# Patient Record
Sex: Female | Born: 1945 | Race: White | Hispanic: No
Health system: Midwestern US, Community
[De-identification: ages and names within clinical notes are randomized; demographics above are authoritative.]

## PROBLEM LIST (undated history)

## (undated) DIAGNOSIS — R55 Syncope and collapse: Secondary | ICD-10-CM

## (undated) DIAGNOSIS — M810 Age-related osteoporosis without current pathological fracture: Secondary | ICD-10-CM

## (undated) DIAGNOSIS — E785 Hyperlipidemia, unspecified: Secondary | ICD-10-CM

## (undated) DIAGNOSIS — I8390 Asymptomatic varicose veins of unspecified lower extremity: Secondary | ICD-10-CM

## (undated) DIAGNOSIS — Z933 Colostomy status: Secondary | ICD-10-CM

## (undated) DIAGNOSIS — K219 Gastro-esophageal reflux disease without esophagitis: Secondary | ICD-10-CM

## (undated) DIAGNOSIS — F439 Reaction to severe stress, unspecified: Secondary | ICD-10-CM

## (undated) DIAGNOSIS — K5792 Diverticulitis of intestine, part unspecified, without perforation or abscess without bleeding: Secondary | ICD-10-CM

## (undated) DIAGNOSIS — K4090 Unilateral inguinal hernia, without obstruction or gangrene, not specified as recurrent: Secondary | ICD-10-CM

## (undated) DIAGNOSIS — Z9289 Personal history of other medical treatment: Secondary | ICD-10-CM

## (undated) DIAGNOSIS — Z87898 Personal history of other specified conditions: Secondary | ICD-10-CM

## (undated) DIAGNOSIS — Z9109 Other allergy status, other than to drugs and biological substances: Secondary | ICD-10-CM

## (undated) DIAGNOSIS — I10 Essential (primary) hypertension: Secondary | ICD-10-CM

## (undated) DIAGNOSIS — M5430 Sciatica, unspecified side: Secondary | ICD-10-CM

## (undated) DIAGNOSIS — K635 Polyp of colon: Secondary | ICD-10-CM

## (undated) HISTORY — DX: Essential (primary) hypertension: I10

## (undated) HISTORY — DX: Hyperlipidemia, unspecified: E78.5

## (undated) HISTORY — DX: Polyp of colon: K63.5

## (undated) HISTORY — DX: Unilateral inguinal hernia, without obstruction or gangrene, not specified as recurrent: K40.90

## (undated) HISTORY — PX: COLON SURGERY: SHX602

---
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
## 1972-11-10 HISTORY — PX: ECTOPIC PREGNANCY SURGERY: SHX613

## 2000-09-11 ENCOUNTER — Ambulatory Visit (HOSPITAL_COMMUNITY): Admission: RE | Admit: 2000-09-11 | Discharge: 2000-09-11 | Payer: Self-pay | Admitting: Gastroenterology

## 2000-09-16 ENCOUNTER — Ambulatory Visit (HOSPITAL_COMMUNITY): Admission: RE | Admit: 2000-09-16 | Discharge: 2000-09-16 | Payer: Self-pay | Admitting: Family Medicine

## 2000-09-16 ENCOUNTER — Encounter: Payer: Self-pay | Admitting: Family Medicine

## 2000-09-24 ENCOUNTER — Other Ambulatory Visit: Admission: RE | Admit: 2000-09-24 | Discharge: 2000-09-24 | Payer: Self-pay | Admitting: Family Medicine

## 2001-04-21 ENCOUNTER — Encounter: Payer: Self-pay | Admitting: Family Medicine

## 2001-04-21 ENCOUNTER — Encounter: Admission: RE | Admit: 2001-04-21 | Discharge: 2001-04-21 | Payer: Self-pay | Admitting: Family Medicine

## 2001-05-05 ENCOUNTER — Encounter: Admission: RE | Admit: 2001-05-05 | Discharge: 2001-06-09 | Payer: Self-pay | Admitting: Family Medicine

## 2003-01-22 ENCOUNTER — Encounter: Payer: Self-pay | Admitting: Emergency Medicine

## 2003-01-22 ENCOUNTER — Emergency Department (HOSPITAL_COMMUNITY): Admission: EM | Admit: 2003-01-22 | Discharge: 2003-01-22 | Payer: Self-pay | Admitting: Emergency Medicine

## 2003-03-07 ENCOUNTER — Ambulatory Visit (HOSPITAL_COMMUNITY): Admission: RE | Admit: 2003-03-07 | Discharge: 2003-03-07 | Payer: Self-pay | Admitting: *Deleted

## 2003-03-14 ENCOUNTER — Ambulatory Visit (HOSPITAL_COMMUNITY): Admission: RE | Admit: 2003-03-14 | Discharge: 2003-03-14 | Payer: Self-pay | Admitting: Internal Medicine

## 2004-07-17 ENCOUNTER — Other Ambulatory Visit: Admission: RE | Admit: 2004-07-17 | Discharge: 2004-07-17 | Payer: Self-pay | Admitting: Family Medicine

## 2005-11-20 ENCOUNTER — Other Ambulatory Visit: Admission: RE | Admit: 2005-11-20 | Discharge: 2005-11-20 | Payer: Self-pay | Admitting: Family Medicine

## 2005-11-24 ENCOUNTER — Ambulatory Visit (HOSPITAL_COMMUNITY): Admission: RE | Admit: 2005-11-24 | Discharge: 2005-11-24 | Payer: Self-pay | Admitting: Family Medicine

## 2006-09-17 ENCOUNTER — Emergency Department (HOSPITAL_COMMUNITY): Admission: EM | Admit: 2006-09-17 | Discharge: 2006-09-17 | Payer: Self-pay | Admitting: Emergency Medicine

## 2006-09-24 ENCOUNTER — Inpatient Hospital Stay (HOSPITAL_COMMUNITY): Admission: EM | Admit: 2006-09-24 | Discharge: 2006-10-04 | Payer: Self-pay | Admitting: Emergency Medicine

## 2006-10-19 ENCOUNTER — Ambulatory Visit (HOSPITAL_COMMUNITY): Admission: RE | Admit: 2006-10-19 | Discharge: 2006-10-19 | Payer: Self-pay | Admitting: Surgery

## 2007-01-13 ENCOUNTER — Other Ambulatory Visit: Admission: RE | Admit: 2007-01-13 | Discharge: 2007-01-13 | Payer: Self-pay | Admitting: Family Medicine

## 2007-01-15 ENCOUNTER — Encounter: Admission: RE | Admit: 2007-01-15 | Discharge: 2007-01-15 | Payer: Self-pay | Admitting: General Surgery

## 2008-07-19 ENCOUNTER — Other Ambulatory Visit: Admission: RE | Admit: 2008-07-19 | Discharge: 2008-07-19 | Payer: Self-pay | Admitting: Family Medicine

## 2008-08-25 ENCOUNTER — Ambulatory Visit (HOSPITAL_COMMUNITY): Admission: RE | Admit: 2008-08-25 | Discharge: 2008-08-25 | Payer: Self-pay | Admitting: Family Medicine

## 2010-11-10 HISTORY — PX: HERNIA REPAIR: SHX51

## 2011-03-20 ENCOUNTER — Inpatient Hospital Stay (HOSPITAL_COMMUNITY)
Admission: EM | Admit: 2011-03-20 | Discharge: 2011-03-27 | DRG: 330 | Disposition: A | Discharge status: Skilled Nursing Facility | Payer: 59 | Attending: General Surgery | Admitting: General Surgery

## 2011-03-20 DIAGNOSIS — E785 Hyperlipidemia, unspecified: Secondary | ICD-10-CM | POA: Diagnosis present

## 2011-03-20 DIAGNOSIS — G40909 Epilepsy, unspecified, not intractable, without status epilepticus: Secondary | ICD-10-CM | POA: Diagnosis present

## 2011-03-20 DIAGNOSIS — I10 Essential (primary) hypertension: Secondary | ICD-10-CM | POA: Diagnosis present

## 2011-03-20 DIAGNOSIS — K4030 Unilateral inguinal hernia, with obstruction, without gangrene, not specified as recurrent: Principal | ICD-10-CM | POA: Diagnosis present

## 2011-03-20 DIAGNOSIS — K560 Paralytic ileus: Secondary | ICD-10-CM | POA: Diagnosis not present

## 2011-03-20 DIAGNOSIS — R112 Nausea with vomiting, unspecified: Secondary | ICD-10-CM | POA: Diagnosis present

## 2011-03-21 ENCOUNTER — Other Ambulatory Visit: Payer: Self-pay | Admitting: General Surgery

## 2011-03-21 ENCOUNTER — Emergency Department (HOSPITAL_COMMUNITY): Payer: 59

## 2011-03-21 LAB — CBC
HCT: 34.3 % — ABNORMAL LOW (ref 36.0–46.0)
HCT: 41.5 % (ref 36.0–46.0)
Hemoglobin: 14.4 g/dL (ref 12.0–15.0)
MCHC: 33.8 g/dL (ref 30.0–36.0)
MCV: 83.8 fL (ref 78.0–100.0)
Platelets: 215 10*3/uL (ref 150–400)
Platelets: 283 10*3/uL (ref 150–400)
RBC: 4.95 MIL/uL (ref 3.87–5.11)
RDW: 12.5 % (ref 11.5–15.5)
WBC: 13 10*3/uL — ABNORMAL HIGH (ref 4.0–10.5)
WBC: 16.1 10*3/uL — ABNORMAL HIGH (ref 4.0–10.5)

## 2011-03-21 LAB — BASIC METABOLIC PANEL
BUN: 10 mg/dL (ref 6–23)
BUN: 10 mg/dL (ref 6–23)
CO2: 25 mEq/L (ref 19–32)
Calcium: 9.7 mg/dL (ref 8.4–10.5)
Chloride: 97 mEq/L (ref 96–112)
Creatinine, Ser: 0.51 mg/dL (ref 0.4–1.2)
GFR calc non Af Amer: >60 mL/min (ref >60)
GFR calc non Af Amer: >60 mL/min (ref >60)
Glucose, Bld: 126 mg/dL — ABNORMAL HIGH (ref 70–99)
Glucose, Bld: 127 mg/dL — ABNORMAL HIGH (ref 70–99)
Potassium: 3.3 mEq/L — ABNORMAL LOW (ref 3.5–5.1)
Sodium: 137 mEq/L (ref 135–145)

## 2011-03-21 LAB — MRSA PCR SCREENING: MRSA by PCR: NEGATIVE

## 2011-03-21 LAB — DIFFERENTIAL
Basophils Absolute: 0 10*3/uL (ref 0.0–0.1)
Neutro Abs: 14.9 10*3/uL — ABNORMAL HIGH (ref 1.7–7.7)

## 2011-03-22 LAB — CBC
MCH: 29 pg (ref 26.0–34.0)
MCHC: 33.2 g/dL (ref 30.0–36.0)
RDW: 13.1 % (ref 11.5–15.5)
WBC: 9.6 10*3/uL (ref 4.0–10.5)

## 2011-03-22 LAB — DIFFERENTIAL
Basophils Absolute: 0 10*3/uL (ref 0.0–0.1)
Lymphs Abs: 0.7 10*3/uL (ref 0.7–4.0)
Monocytes Absolute: 0.7 10*3/uL (ref 0.1–1.0)
Neutro Abs: 8.2 10*3/uL — ABNORMAL HIGH (ref 1.7–7.7)
Neutrophils Relative %: 86 % — ABNORMAL HIGH (ref 43–77)

## 2011-03-22 LAB — BASIC METABOLIC PANEL
Creatinine, Ser: <0.47 mg/dL (ref 0.4–1.2)
Glucose, Bld: 138 mg/dL — ABNORMAL HIGH (ref 70–99)

## 2011-03-23 LAB — BASIC METABOLIC PANEL
BUN: 5 mg/dL — ABNORMAL LOW (ref 6–23)
CO2: 26 mEq/L (ref 19–32)
Glucose, Bld: 125 mg/dL — ABNORMAL HIGH (ref 70–99)
Potassium: 3.5 mEq/L (ref 3.5–5.1)

## 2011-03-23 LAB — CBC
HCT: 27.7 % — ABNORMAL LOW (ref 36.0–46.0)
MCHC: 33.2 g/dL (ref 30.0–36.0)
Platelets: 163 10*3/uL (ref 150–400)
RBC: 3.17 MIL/uL — ABNORMAL LOW (ref 3.87–5.11)
RDW: 12.9 % (ref 11.5–15.5)
WBC: 9.5 10*3/uL (ref 4.0–10.5)

## 2011-03-23 LAB — DIFFERENTIAL
Eosinophils Relative: 0 % (ref 0–5)
Monocytes Absolute: 0.6 10*3/uL (ref 0.1–1.0)
Neutro Abs: 8.4 10*3/uL — ABNORMAL HIGH (ref 1.7–7.7)
Neutrophils Relative %: 88 % — ABNORMAL HIGH (ref 43–77)

## 2011-03-24 LAB — BASIC METABOLIC PANEL
BUN: 5 mg/dL — ABNORMAL LOW (ref 6–23)
CO2: 24 mEq/L (ref 19–32)
Calcium: 7.8 mg/dL — ABNORMAL LOW (ref 8.4–10.5)
Glucose, Bld: 98 mg/dL (ref 70–99)

## 2011-03-25 LAB — CARDIAC PANEL(CRET KIN+CKTOT+MB+TROPI): Troponin I: <0.3 ng/mL (ref <0.30)

## 2011-03-25 LAB — BASIC METABOLIC PANEL
BUN: 3 mg/dL — ABNORMAL LOW (ref 6–23)
CO2: 25 mEq/L (ref 19–32)
Calcium: 8.6 mg/dL (ref 8.4–10.5)
Chloride: 106 mEq/L (ref 96–112)
Creatinine, Ser: <0.47 mg/dL (ref 0.4–1.2)
Potassium: 4 mEq/L (ref 3.5–5.1)

## 2011-03-26 NOTE — H&P (Signed)
[name], [name]                 ACCOUNT NO.:  [id number]  MEDICAL RECORD NO.:  [id number]           PATIENT TYPE:  E  LOCATION:  WLED                         FACILITY:  [facility]  PHYSICIAN:  Adolph Pollack, M.D.DATE OF BIRTH:  1946-02-19  DATE OF ADMISSION:  03/20/2011 DATE OF DISCHARGE:                             HISTORY & PHYSICAL   REASON FOR ADMISSION:  Incarcerated right inguinal hernia.  HISTORY:  This is a 65 year old female who for 3 years has known about a right inguinal hernia and intermittently has to reduce it.  She saw Dr. Violeta Gelinas 3 years ago at which time she was having some acute diverticulitis.  She was told to return to discuss the hernia repair, but did not do so at the time.  She does have to intermittently reduce it.  About 3 o'clock p.m. yesterday the hernia became large and hard and not reducible.  She subsequently presented to the emergency department where she was also to have a nonreducible hernia.  She has had some nausea, vomiting with no fever, chills.  I subsequently was asked to see her.  She has been able to have a bowel movement today and is voiding without difficulty.  PAST MEDICAL HISTORY: 1. Diverticulitis. 2. Hypertension. 3. Hypercholesterolemia. 4. Seizure disorder.  Previous operations, exploratory laparotomy for ectopic pregnancy.  ALLERGIES:  None.  MEDICATIONS:  Include aspirin, hydrochlorothiazide, metoprolol, simvastatin.  SOCIAL HISTORY:  She is single.  She has a son who lives in [place]. She denies tobacco or alcohol use.  She works at [employer].  Her family history is noncontributory.  REVIEW OF SYSTEMS:  GENERAL:  No fever or chills.  CARDIOVASCULAR:  She says that at one time she had a little irregular heartbeat but no myocardial infarction.  PULMONARY:  No shortness of breath.  GI:  As per HPI.  RENAL:  No kidney disease or kidney stones.  HEMATOLOGIC:  No bleeding disorders or blood clots.  NEUROLOGIC:   No strokes.  PHYSICAL EXAMINATION:  GENERAL:  A comfortable female, awake, alert, very pleasant and cooperative. VITAL SIGNS:  Temperature is 97.8, heart rate 70, blood pressure 155/94, respiratory rate 70, O2 sat 99% on room air. HEENT:  Notable for some dry mucous membranes, no icterus. NECK:  Supple without masses. RESPIRATORY:  Breath sounds equal and clear, respirations unlabored. CARDIOVASCULAR:  Regular rate, regular rhythm.  No murmur. ABDOMEN:  Soft, nontender.  It is flat with active bowel sounds.  There is a lower midline scar present. GU:  There is a large nonreducible right inguinal hernia that is tender, it is tracking down to the right vulva. MUSCULOSKELETAL:  No edema.  Good range of motion. SKIN:  No jaundice.  LABORATORY DATA:  White cell count elevated at 16,100, hemoglobin 14.4. Platelet count 283,000.  BMET demonstrates normal electrolytes except for potassium of 3.3 and glucose 126.  EKG demonstrates normal sinus rhythm.  IMPRESSION:  Incarcerated right inguinal hernia.  PLAN:  Emergency repair of incarcerated right inguinal hernia, repair with mesh, possible bowel resection, possible colostomy.  I had a long discussion with her about the procedure,  and the benefits of the procedure and risks.  I told her ideally we would try to use mesh, but if any intestinal resection had to be done then we would not use mesh and a risk of recurrence of the hernia would be higher.  We went over the risks of surgery including but not limited to bleeding, infection, wound healing problems, anesthesia, recurrence, numbness, accidental damage to intra-abdominal organs.  We also talked about her not being able to do any heavy lifting for 6 weeks.  I explained some of this to her son as well over the phone.  They seemed to understand and agree with the plan.     Adolph Pollack, M.D.     Kari Baars  D:  03/21/2011  T:  03/21/2011  Job:  [id number]  Electronically Signed  by Avel Peace M.D. on 03/26/2011 07:54:34 AM

## 2011-03-26 NOTE — Op Note (Signed)
[name], [name]                 ACCOUNT NO.:  [id number]  MEDICAL RECORD NO.:  [id number]           PATIENT TYPE:  E  LOCATION:  WLED                         [facility]ACILITY:  [facility]  PHYSICIAN:  Adolph Pollack, M.D.DATE OF BIRTH:  January 03, 1946  DATE OF PROCEDURE:  03/21/2011 DATE OF DISCHARGE:                              OPERATIVE REPORT   PREOPERATIVE DIAGNOSIS:  Incarcerated right inguinal hernia.  POSTOPERATIVE DIAGNOSIS:  Strangulated right inguinal hernia (part of right colon).  PROCEDURE: 1. Repair of right inguinal hernia (modified Bassini technique). 2. Exploratory laparotomy and partial right colectomy with resection     of terminal ileum.  SURGEON:  Adolph Pollack, M.D.  ANESTHESIA:  General.  INDICATIONS:  This is a 65 year old female with a longstanding right inguinal hernia that she had manually reduced.  At about 3 p.m. on Mar 20, 2011, the hernia came out, was not able to be manually reduced.  She presented to the emergency department and was seen.  The hernia was enabled to be reduced.  She had a white count 16,000.  Now she is brought to the operating room for emergency repair, possible bowel resection.  TECHNIQUE:  She was seen in the holding area and right groin marked my initials.  The patient was then brought the operating room, placed supine on the operating room table.  General anesthetic was administered.  The hair in the right groin was clipped and Foley catheter was inserted.  The entire groin and abdominal wall sterilely prepped and draped.  Right groin incision was made through the skin and subcutaneous tissue. I then made incision through the external oblique aponeurosis, noted a large indirect hernia going down into the vulva.  I had to enlarge the incision in the inguinal ligament area, then began reducing the hernia contents manually.  Initially most of what I saw through the thin sac was viable but then some became dark.  I opened up  the sac and approximately one-third of the proximal right colon was necrotic, ischemic and it was not a reversible process.  I opened up the part of the inguinal floor to allow for reduction.  I tied a silk suture on to the area of the right colon that was viable just proximal to the area that was ischemic and necrotic.  I then made a midline incision beginning above the umbilicus and extending down to the midportion of the lower midline.  The subcutaneous tissue, fascia and peritoneum were divided with electrocautery.  I then reduced the intestinal contents back into the peritoneal cavity.  Then I plan to chose a point of viable ascending colon and create a small mesenteric defect just around it and then divided the right colon with a GIA stapler.  I then divided the terminal ileum with GIA stapler close to the ileocecal valve.  The mesentery was then divided with a super jaw device.  Specimen was handed off the field as the terminal ileum and proximal third of the right colon.  I then performed a side-to-side stapled anastomosis between the distal ileum and the mid right colon.  Mucosa was viable  in both aspects and the common defect was closed with a linear noncutting stapler.  A 3-0 silk was used to reinforce the distal suture line.  The mesenteric defect was closed with interrupted controlled 3-0 silk suture.  The anastomosis was patent, viable and under no tension.  Gloves were changed.  I then copiously irrigated out the abdominal cavity and there was no evidence of bleeding.  I then closed the midline fascia with running #1 PDS suture.  Moist gauze was placed in the subcutaneous tissue.  Needle, sponge and instrument counts were reported be correct after this part of the procedure.  Next, I reapproached the right groin wound.  I then mobilized the peritoneal sac out of the right vulva area.  I then ligated the peritoneal sac with 2-0 silk pursestring suture and excised excess  sac. I reduced the sac back into the peritoneal cavity.  I then closed the hernia defect using a modified Bassini technique approximating the conjoint tendon and the internal oblique aponeurosis to the shelving edge of the inguinal ligament with interrupted 0 Novafil sutures.  A relaxing incision was then made allowing there to be no tension.  I then irrigated out this wound and no bleeding was noted.  I closed the external oblique aponeurosis with a running 3-0 Vicryl suture.  Scarpa's fascia was closed with running 3-0 Vicryl suture.  The skin at the right groin incision was closed with staples.  The skin at the midline incision was partially closed with staples with Telfa wicks placed in between.  Bulky dressings were applied.  She tolerated the procedures well without any apparent complications and was taken to recovery in satisfactory condition.     Adolph Pollack, M.D.     [name]  D:  03/21/2011  T:  03/21/2011  Job:  [id number]  cc:   Otilio Connors. Gerri Spore, M.D. Fax: [phone]  Electronically Signed by Avel Peace M.D. on 03/26/2011 07:55:38 AM

## 2011-03-28 NOTE — Cardiovascular Report (Signed)
[name], BUFKIN                           ACCOUNT NO.:  [id number]   MEDICAL RECORD NO.:  [id number]                   PATIENT TYPE:  OIB   LOCATION:  2899                                 FACILITY:  [facility]   PHYSICIAN:  Meade Maw, M.D.                 DATE OF BIRTH:  July 02, 1946   DATE OF PROCEDURE:  03/07/2003  DATE OF DISCHARGE:  03/07/2003                              CARDIAC CATHETERIZATION   REFERRING PHYSICIAN:  Dr. Duwayne Heck L. Mahaffey.   INDICATION FOR PROCEDURE:  Syncope with abnormal stress Cardiolite.   PROCEDURE:  After obtaining written informed consent, the patient was  brought to the cardiac catheterization lab in the post-absorptive state.  Preop sedation was achieved using IV Versed.  The right groin was prepped  and draped in the usual sterile fashion.  Local anesthesia was achieved 1%  Xylocaine.  A 6-French hemostasis sheath was placed into the right femoral  artery using a modified Seldinger technique.  Selective coronary angiography  was performed using JL4 and JR4 Judkins catheters.  Multiple views were  obtained.  All catheter exchanges were made over a guidewire.  Single-plane  ventriculogram was performed in the RAO position.  All catheter exchanges  were made over a guidewire.  The hemostasis sheath was flushed following  each engagement.  Following review of the films, there was no identifiable  disease.  The patient was transferred to the holding area.  The hemostasis  sheath was removed.  Hemostasis was achieved using digital pressure.   FINDINGS:  Aortic pressure was 126/73.  LV pressure was 125/3.  EDP is 14.   CORONARY ANGIOGRAPHY:  Left main coronary artery:  The left main coronary  artery bifurcates into the left anterior descending and circumflex vessel.  There is no disease in the left main coronary artery.   Left anterior descending:  Left anterior descending gives rise to a moderate  D-1, large D-2 and goes on to end as an apical  branch.  There is no disease  in the left anterior descending or its branches.   Circumflex vessel:  Circumflex vessel is a moderate-sized vessel that gives  rise to an early OM-1, larger OM-2, then ends as an A-V groove vessel.  There is no disease in the circumflex or its branches.   Right coronary artery:  The right coronary artery is a large artery and  dominant part of posterior circulation, and gives rise to two RV marginals,  a large PDA branch and a large PL branch.  There is no disease in the right  coronary artery or its branches.   FINAL IMPRESSION:  1. Normal coronary angiography.  2. Normal ventriculogram, ejection fraction 65%.  No mitral regurgitation     noted.  3. False-positive stress Cardiolite.   RECOMMENDATION:  Recommendation will be:  Consider other etiologies for  syncope.  A tilt table test will be performed for  further evaluation.                                              Meade Maw, M.D.   HP/MEDQ  D:  03/07/2003  T:  03/08/2003  Job:  [id number]

## 2011-03-28 NOTE — H&P (Signed)
[name], [name]                 ACCOUNT NO.:  [id number]   MEDICAL RECORD NO.:  [id number]          PATIENT TYPE:  INP   LOCATION:  1824                         FACILITY:  [facility]   PHYSICIAN:  Hollice Espy, M.D.DATE OF BIRTH:  [birth year]   DATE OF ADMISSION:  09/24/2006  DATE OF DISCHARGE:                                HISTORY & PHYSICAL   CHIEF COMPLAINT:  Abdominal pain.   HISTORY OF PRESENT ILLNESS:  The patient is a 65 year old white female with  a past medical history of hypertension and hyperlipidemia, who presents to  the emergency room as a direct admission after she failed outpatient  treatment for diverticulitis.  The patient has never previously had issues  with diverticulitis but her pain started ten days ago.  It was initially  described as vague abdominal pain, but mostly in the left lower quadrant.  Symptoms continue to progress and three days later she saw her primary care  physician, Dr. Shaune Pollack.  At that time, she had a CT scan of the abdomen  which showed evidence of diverticulitis as well as a controlled abscess.  Dr. Kevan Ny has started the patient on p.o. Cipro and Flagyl.  She tolerated  this somewhat well, however, her pain continued to persist and she had  progressive nausea and vomiting.  She finally could not take anymore and  came in and followed up today with Dr. Kevan Ny.  Dr. Kevan Ny checked a white  count which was found to be elevated at 15.  At this point, it was felt that  the patient best needed to come in for further evaluation and possible  surgical consultation, but at the very least, hydration and IV antibiotics.  Dr. Kevan Ny contacted myself and the patient came in as a direct admission.   Currently, the patient is feeling a little bit better.  She denies any  headaches, vision changes, dysphagia, chest pain, palpitations, shortness of  breath, wheeze, cough, abdominal pain, hematuria, dysuria, constipation,  diarrhea, focal extremity  weakness, numbness, or pain.  Review of systems  is, otherwise, negative.   PAST MEDICAL HISTORY:  Hypertension, hyperlipidemia, osteoporosis, insomnia,  and diverticulosis.   MEDICATIONS:  The patient is on Toprol 50 mg p.o. daily, hydrochlorothiazide  12.5 p.o. daily, Zocor 40 p.o. daily, Fosamax 70 mg p.o. weekly, Simvastatin  40 p.o. daily, and Ativan 0.5 mg p.o. 2-3 times a day p.r.n., aspirin 81 mg  p.o. daily, multi-vitamin daily, fish oil 1000 daily, Tums b.i.d., and  vitamin C 1000 p.o. daily.   ALLERGIES:  She has no known drug allergies.   SOCIAL HISTORY:  She denies any tobacco or drug use.  She occasionally  drinks a glass of wine.   FAMILY HISTORY:  Noncontributory.   PHYSICAL EXAMINATION:  VITAL SIGNS:  On admission, the patient is afebrile, heart rate 87,  respirations 18, 02 saturation is 98% on room air, blood pressure 148/67.  HEENT:  Normocephalic, atraumatic, mucous membranes dry.  NECK:  No carotid bruits.  HEART:  Regular rate and rhythm, S1 and S2.  LUNGS:  Clear to auscultation bilaterally.  ABDOMEN:  Soft, nontender, nondistended, positive bowel sounds.  EXTREMITIES:  No cyanosis, clubbing, and edema.   LABORATORY DATA:  Based on Dr. Kevan Ny' outpatient labs, white count 15,  hemoglobin 14.2, hematocrit 43.3, MCV 85, platelet count 615.   ASSESSMENT AND PLAN:  1. Diverticulitis.  We will start by treating the patient with n.p.o. and      IV Cipro and Flagyl.  We will repeat a white count in the morning as      well as recheck her CT scan in the next 24-48 hours.  If her CT scan is      worse than the previous films of which we have a copy, then we will      consider getting a surgery consult.  At this time, I think we can treat      her with IV antibiotics and bowel rest and this should do the trick.  2. Anxiety.  Place the patient on p.r.n. IV Ativan.  3. Hypertension.  Place the patient on IV Atenolol.      Hollice Espy, M.D.   Electronically Signed     SKK/MEDQ  D:  09/24/2006  T:  09/24/2006  Job:  [id number]   cc:   Duncan Dull, M.D.

## 2011-03-28 NOTE — Discharge Summary (Signed)
NAMEBLAKELEY, SCHEIER                 ACCOUNT NO.:  [id number]   MEDICAL RECORD NO.:  [id number]          PATIENT TYPE:  INP   LOCATION:  6738                         FACILITY:  [facility]   PHYSICIAN:  Hollice Espy, M.D.DATE OF BIRTH:  1945-12-06   DATE OF ADMISSION:  09/24/2006  DATE OF DISCHARGE:  10/04/2006                                 DISCHARGE SUMMARY   CONSULTATIONS:  Wilmon Arms. Corliss Skains, M.D., [facility].   PRIMARY CARE PHYSICIAN:  Duncan Dull, M.D.   DISCHARGE DIAGNOSES:  1. Diverticulitis.  2. Secondary pericolic abscess.  3. Hyperkalemia, resolved.  4. Hypertension.  5. Anxiety.   DISCHARGE MEDICATIONS:  Patient was resumed on previous medications.  These  are aspirin 81 p.o. daily, HCTZ 12.5 p.o. daily, Zocor 40 p.o. daily, Toprol  XL 50 p.o. daily, Fosamax 70 mg p.o. week, clonidine 0.5 to 1 mg p.o. 1-3  times a day p.r.n., Darvocet 100/650 p.o. q.6h. p.r.n.  She will also be  discharged on  Flagyl 500 mg 1 p.o. t.i.d. x2 weeks and Cipro 500 mg p.o.  b.i.d. x2 weeks.   FOLLOW-UP APPOINTMENTS:  She will follow up with her PCP, Dr. Shaune Pollack,  in the next two weeks.  She will follow up with Dr. Marcille Blanco of [facility] in two weeks.  She will also have a repeat CAT scan done at  that time.   The patient's overall disposition from initial presentation, has improved.   ACTIVITY:  As tolerated.   DIET:  Patient is being discharged on a low residue diet as well as low  sodium.   HOSPITAL COURSE:  Patient is a 65 year old white female with past medical  history of hypertension and anxiety, who presents to the emergency room  after she failed outpatient treatment for diverticulitis.  She never  previously had any previous attacks, but on this one, she had some vague  abdominal pain in the left lower quadrant.  A CT scan was ordered as an  outpatient.  She was found to have diverticulitis as well as an abscess.  She was started on  p.o. Cipro and Flagyl, but she started having persistent  nausea, vomiting, and pain, and she did not show signs of improvement.  Her  PCP ordered a follow-up white count after several days of outpatient  antibiotics, and she was found to have a white count of 15.  At that point,  it was felt best that she come in for treatment.  Patient was started on IV  antibiotics as well as medication for pain and nausea and otherwise was made  n.p.o.  A repeat CT scan done as an inpatient showed a persistent collection  of air fluid on her left pelvic side wall measuring 3.8 x 4.2 x 3 cm,  similar to a previous scan.  At that point, [facility] was  consulted, and they recommended, for now, bowel rest, continuing  antibiotics, given that the patient's white count was chronically improving,  and go from there.   Over the next several days, the patient did  well.  On November 19, her white  count briefly elevated to 11.5 and then up to as high as 12.4.  Repeat CT  scan done on the 19th showed no change in left pelvic diverticular abscess,  question of increased uterine gas, and question of fistula to the abscess.  With these findings, there was a concern about possible worsening of her  abscess.  [facility] had re-evaluated this, and while these  findings with the increased white count was slightly of concern, on the  other hand, the patient was clinically better at this point.  Her diet had  been advanced, and she was tolerating clear liquids.  Given these findings,  the patient's IV Cipro was changed to Zosyn, but otherwise she was  tolerating well.  Back to her white count, by November 21, it had come down  to 10.1, and this was prior to the antibiotic change.  Over the next several  days, the patient did well.   By November 22, she had not been on low residue diet x1 day.  Her Zosyn and  Flagyl were changed from IV over to p.o., and she was continually watched.  She  remained stable.  On November 23 was doing well.  She said she felt as  if she was in a fog; however, she had been receiving Phenergan for her first  few days of Cipro and Flagyl, which can be a side effect.  She otherwise  seemed to be doing well, remaining afebrile with a normal white count.   By November 24, the patient was feeling better.  She was noted to have  occasional episodes of tachycardia with a heart rate as high as 105;  however, she remained afebrile, and her white count was still falling and  continued to remain normal.  At this point now, she has been on the solid  food diet now for several days as well as p.o. antibiotics x2 days as well  as a normal white count.  The plan will be for patient to remain stable.  Discharged home on November 25.  I will continue to follow to recheck her  white count and make sure she is not having any worsening signs of  infection, and perhaps a repeat CT scan may be in order.  On the other hand,  this increased tachycardia may solely be from increased blood pressure.  She  has a history of hypertension, on a beta blocker for anxiety.  She is only  getting p.r.n. Ativan here.  Otherwise, she appears to be doing quite well  and she herself feels good.  She will keep her follow-up appointment and a  repeat CAT scan in two weeks.   Patient's overall disposition is improved, and she is being discharged to  home.      Hollice Espy, M.D.  Electronically Signed     SKK/MEDQ  D:  10/03/2006  T:  10/03/2006  Job:  [id number]   cc:   Wilmon Arms. Corliss Skains, M.D.  [address] [place] [address]  [place]   Duncan Dull, M.D.  Fax: [phone]

## 2011-03-28 NOTE — Consult Note (Signed)
[name], Little                 ACCOUNT NO.:  [id number]   MEDICAL RECORD NO.:  [id number]          PATIENT TYPE:  INP   LOCATION:  6738                         FACILITY:  [facility]   PHYSICIAN:  Wilmon Arms. Corliss Skains, M.D. DATE OF BIRTH:  11/07/1946   DATE OF CONSULTATION:  09/26/2006  DATE OF DISCHARGE:                                   CONSULTATION   CONSULTING PHYSICIAN:  Dr. Arthor Captain.   REASON FOR CONSULTATION:  Diverticular abscess.   The patient is a 65 year old female in reasonably good health who was  admitted to hospital on September 24, 2006 with diverticulitis.  This is the  patient's first episode of diverticulitis and it began about 10 days ago.  She initially had vague abdominal pain in the left lower quadrant.  Her  symptoms progressed and she saw her primary care physician, Dr. Shaune Pollack.  The patient was on oral Cipro and Flagyl.  A CT scan showed a small  controlled abscess.  Her symptoms did not improve with Cipro and Flagyl and  so she was admitted to hospital.  Her initial white count was 15.  The  patient was admitted and a repeat CT scan on September 25, 2006 showed a  persistent collection of air fluid on the left pelvic sidewall measuring 4.8  x 4.2 x 3.0 cm.  This is about the same size as the previous scan.  No other  fluid collections or abnormalities are identified.  We are now consulted for  surgical recommendations.   PAST MEDICAL HISTORY:  1. Hypertension.  2. Hyperlipidemia.  3. Osteoporosis.  4. Insomnia.  5. Diverticulosis.  Her diverticulosis was diagnosed on a colonoscopy last      year.   PAST SURGICAL HISTORY:  No previous abdominal surgeries.   MEDICATIONS:  Toprol, hydrochlorothiazide, Zocor, Fosamax, Ativan,  multivitamin, fish oil, Tums and vitamin C.   ALLERGIES:  NONE.   SOCIAL HISTORY:  Nonsmoker, occasional glass of wine.   FAMILY HISTORY:  Noncontributory.   PHYSICAL EXAMINATION:  VITAL SIGNS:  The patient has been afebrile  throughout her hospital stay, pulse 89, blood pressure 115/78.  GENERAL:  This is a well-developed, well-nourished female in no apparent  distress.  HEENT:  EOMI.  Sclerae are anicteric.  NECK:  No masses, no thyromegaly.  LUNGS:  Clear to auscultation bilaterally.  Normal respiratory effort.  HEART:  Regular rate and rhythm with no murmur.  ABDOMEN:  Soft, nontender, nondistended.  Normoactive bowel sounds.  EXTREMITIES:  No edema.  SKIN:  Warm and dry with no sign of jaundice.   MOST RECENT LABORATORIES:  Electrolytes within normal limits today.  CBC  yesterday shows a white count of 9.6, hemoglobin 11.7, platelet count 445.   IMPRESSION:  Primary attack of sigmoid diverticulitis with a localized  abscess in the left lower quadrant.   RECOMMENDATIONS:  At this time there are no acute surgical indications.  Would recommend a CT-guided drainage of the pelvic abscess.  Continue  intravenous antibiotics (Cipro and Flagyl).  We will monitor the patient  closely in case she might need urgent  surgery.   Of note, the patient has a right inguinal hernia for which she has seen Dr.  Jaclynn Guarneri.  She is scheduled to have this repaired on an elective basis.  We may have to delay that due to this current infection.      Wilmon Arms. Tsuei, M.D.  Electronically Signed     MKT/MEDQ  D:  09/26/2006  T:  09/26/2006  Job:  [id number]

## 2011-03-28 NOTE — Op Note (Signed)
[name], [name]                           ACCOUNT NO.:  [id number]   MEDICAL RECORD NO.:  [id number]                   PATIENT TYPE:  OIB   LOCATION:  2899                                 FACILITY:  [facility]   PHYSICIAN:  Doylene Canning. Ladona Ridgel, M.D.               DATE OF BIRTH:  August 20, 1946   DATE OF PROCEDURE:  03/14/2003  DATE OF DISCHARGE:                                 OPERATIVE REPORT   PROCEDURE PERFORMED:  Head up tilt table testing.   INDICATIONS FOR PROCEDURE:  Unexplained syncope.   INTRODUCTION:  The patient is a 65 year old woman with a recent episode of  unexplained syncope.  She was found to have normal LV function and coronary  perfusion by Cardiolite stress testing.  Her baseline QRS demonstrates  normal sinus rhythm with normal intervals.  She is now referred for head up  tilt table testing.   DESCRIPTION OF PROCEDURE:  After informed consent was obtained, the patient  was taken to the diagnostic electrophysiology laboratory in the fasted  state.  After the usual preparation she was placed in the supine position  where her initial blood pressure was 134/84 and the pulse was 92.  She was  placed in the head up tilt table position and her heart rate increased from  the low 90s to the 105 range.  Her blood pressure remained constant with the  systolic pressures in the 130s.  During the initial 30 minutes of head up  tilting, the patient maintained a blood pressure that ranged from the high  130s down as low as 110 mmHg.  Her pressures did not have any significant  fluctuation, dropping gradually or rising gradually.  The heart rate  remained stable with her sinus rhythm in the 105 to 110/115 range.  After 30  minutes in the head up position she was placed back in the supine position  and isoproterenol was infused.  The patient's heart rate increased from the  low 100s up to 130 beats per minute.  She was placed back in the supine  position.  Her heart rate and blood  pressure went from a heart rate of 133  and a blood pressure of 121/81 up to peak blood pressure of 136 with a heart  rate that remained in the 120s and 130s.  The patient had no symptoms of  syncope during this period of time and she was subsequently returned to a  supine position after 15 minutes in the head up tilt position on  isoproterenol.  She was then returned to her room in good condition.   COMPLICATIONS:  There were no immediate procedural complications.    RESULTS:  This head up tilt table with and without isoproterenol  demonstrated no evidence of an early mediated syncope.  The patient's vitals  were fairly stable during her tilting procedure.  Doylene Canning. Ladona Ridgel, M.D.    GWT/MEDQ  D:  03/14/2003  T:  03/14/2003  Job:  [id number]   cc:   Meade Maw, M.D.  [address]  [place]  [place] [address]  Fax: [phone]   Kathrine Cords, R.N. [facility]

## 2011-03-29 ENCOUNTER — Emergency Department (HOSPITAL_COMMUNITY)
Admission: EM | Admit: 2011-03-29 | Discharge: 2011-03-29 | Disposition: A | Discharge status: Home / Self Care | Payer: 59 | Attending: Emergency Medicine | Admitting: Emergency Medicine

## 2011-03-29 DIAGNOSIS — I10 Essential (primary) hypertension: Secondary | ICD-10-CM | POA: Insufficient documentation

## 2011-03-29 DIAGNOSIS — Z9889 Other specified postprocedural states: Secondary | ICD-10-CM | POA: Insufficient documentation

## 2011-03-29 DIAGNOSIS — Z4802 Encounter for removal of sutures: Secondary | ICD-10-CM | POA: Insufficient documentation

## 2011-03-29 DIAGNOSIS — K625 Hemorrhage of anus and rectum: Secondary | ICD-10-CM | POA: Insufficient documentation

## 2011-03-29 DIAGNOSIS — Z7982 Long term (current) use of aspirin: Secondary | ICD-10-CM | POA: Insufficient documentation

## 2011-03-29 DIAGNOSIS — E7800 Pure hypercholesterolemia, unspecified: Secondary | ICD-10-CM | POA: Insufficient documentation

## 2011-03-29 LAB — CBC
MCH: 28.3 pg (ref 26.0–34.0)
MCHC: 33.4 g/dL (ref 30.0–36.0)
MCV: 84.6 fL (ref 78.0–100.0)
Platelets: 412 10*3/uL — ABNORMAL HIGH (ref 150–400)
RBC: 3.57 MIL/uL — ABNORMAL LOW (ref 3.87–5.11)
RDW: 13.3 % (ref 11.5–15.5)

## 2011-03-29 LAB — DIFFERENTIAL
Basophils Relative: 0 % (ref 0–1)
Eosinophils Absolute: 0.1 10*3/uL (ref 0.0–0.7)
Eosinophils Relative: 1 % (ref 0–5)
Lymphs Abs: 0.9 10*3/uL (ref 0.7–4.0)
Monocytes Absolute: 0.5 10*3/uL (ref 0.1–1.0)
Monocytes Relative: 5 % (ref 3–12)

## 2011-03-29 LAB — COMPREHENSIVE METABOLIC PANEL
Albumin: 3.2 g/dL — ABNORMAL LOW (ref 3.5–5.2)
BUN: 13 mg/dL (ref 6–23)
Calcium: 8.9 mg/dL (ref 8.4–10.5)
Chloride: 101 mEq/L (ref 96–112)
Creatinine, Ser: 0.51 mg/dL (ref 0.4–1.2)
GFR calc Af Amer: >60 mL/min (ref >60)
Total Bilirubin: 0.2 mg/dL — ABNORMAL LOW (ref 0.3–1.2)

## 2011-03-29 LAB — ABO/RH: ABO/RH(D): O POS

## 2011-03-29 LAB — TYPE AND SCREEN: Antibody Screen: NEGATIVE

## 2011-04-24 ENCOUNTER — Encounter (INDEPENDENT_AMBULATORY_CARE_PROVIDER_SITE_OTHER): Payer: Self-pay | Admitting: General Surgery

## 2011-04-28 NOTE — Discharge Summary (Signed)
NAME:  [name], [name]                 ACCOUNT NO.:  [id number]  MEDICAL RECORD NO.:  [id number]           PATIENT TYPE:  I  LOCATION:  1444                         FACILITY:  [facility]  PHYSICIAN:  __________, M.D.       DATE OF BIRTH:  Apr 22, 1946  DATE OF ADMISSION:  03/20/2011 DATE OF DISCHARGE:  03/27/2011                              DISCHARGE SUMMARY   HISTORY OF PRESENT ILLNESS:  [name] is a 65 year old female who has known about a right inguinal hernia for the past 3 years and has intermittently been able to reduce it.  She did see one of her surgeons several years ago when she was having some acute diverticulitis and was told to return to discuss her hernia but did never do so.  However, she subsequently reported that the hernia became large and hard and not reducible one day prior to admission.  She presented to the emergency department and was found to have a nonreducible hernia.  She had some associated nausea and vomiting and significant pain.  Dr. Abbey Chatters was asked to evaluate the patient.  Upon his evaluation in the emergency department, she was found to have a significant large nonreducible right inguinal hernia that is tracked as tender, tracking down towards the right vulvar area.  Decision was made to admit the patient for emergent operative intervention.  SUMMARY OF HOSPITAL COURSE:  The patient was admitted on Mar 21, 2011, and was immediately taken to the operating room.  She underwent exploratory laparotomy which found significant evidence of strangulated right colon within the right inguinal hernia.  Therefore, a partial right colectomy with terminal ileectomy was performed with an ileocolonic anastomosis made and then the right inguinal hernia was therefore repaired.  She tolerated this procedure well and was admitted to the floor in a stable condition.  She has some comorbid medical problems including hyperlipidemia and hypertension and seizure  disorder. Therefore, her oral medications were instituted very quickly.  She did develop a little bit of an ileus postoperatively but this appeared to be improving significantly.  By postop day #3, she was started on clear liquid diet and subsequently advanced from there to a solid diet. However, she has had some deconditioning due to her age and surgery, and therefore after therapy wales the patient appears more appropriate for skilled nursing facility for assisted living as the patient lives home by herself and would require closer observation and care.  Therefore, arrangements have been made and a decision has been made on a choice of skilled nursing facility.  The patient has otherwise done very well with no other significant complications during this hospitalization.  It is our opinion that as of today, postop day #6, that the patient is stable for discharge.  DISCHARGE DIAGNOSES: 1. Strangulated right inguinal hernia status post partial right     colectomy with right inguinal hernia repair. 2. Hypertension - stable. 3. Seizure disorder - stable. 4. Hyperlipidemia - stable.  DISCHARGE MEDICATIONS:  As follows, the patient will resume her home medications including: 1. Aspirin 81 mg daily. 2. Calcium carbonate plus D 1 tablet daily.  3. Clonazepam 1 mg half tablet to 1 tablet three times weekly as     needed. 4. Fish oil 1000 mg daily. 5. Fluticasone nasal spray 2 sprays daily as needed. 6. Hydrochlorothiazide 25 mg one-half tablet daily. 7. Melatonin 5 mg 1 tablet daily as needed at bedtime. 8. Metoprolol 50 mg 1 tablet daily. 9. Multivitamin 1 tablet daily. 10.Zofran 4 mg 1 tablet q.8 h. p.r.n. nausea. 11.Simvastatin 40 mg one-half tablet daily. 12.Vitamin C 1000 mg 1 tablet daily. 13.Colace 100 mg 1 tablet twice daily. 14.She is given a prescription for Vicodin 5/325 one to two tablets     q.4 h. p.r.n. pain.  She is given discharge instructions to follow though she  will be monitored closely at the skilled nursing facility.  She is to adhere to a low-sodium heart-healthy diet.  She needs to follow up with Dr. Avel Peace at [facility] in approximately 7 to 10 days for her recheck and staple removal.  She can otherwise shower and activities will be per therapy team at skilled nursing facility.     Brayton El, PA-C   ______________________________ __________, M.D.    KB/MEDQ  D:  03/27/2011  T:  03/27/2011  Job:  [id number]  Electronically Signed by Bertram Savin MD on 04/28/2011 12:12:40 PM

## 2011-05-06 ENCOUNTER — Encounter (INDEPENDENT_AMBULATORY_CARE_PROVIDER_SITE_OTHER): Payer: Self-pay | Admitting: General Surgery

## 2011-05-06 ENCOUNTER — Ambulatory Visit (INDEPENDENT_AMBULATORY_CARE_PROVIDER_SITE_OTHER): Payer: 59 | Admitting: General Surgery

## 2011-05-06 DIAGNOSIS — E7800 Pure hypercholesterolemia, unspecified: Secondary | ICD-10-CM

## 2011-05-06 DIAGNOSIS — D126 Benign neoplasm of colon, unspecified: Secondary | ICD-10-CM

## 2011-05-06 DIAGNOSIS — K635 Polyp of colon: Secondary | ICD-10-CM | POA: Insufficient documentation

## 2011-05-06 DIAGNOSIS — K4040 Unilateral inguinal hernia, with gangrene, not specified as recurrent: Secondary | ICD-10-CM

## 2011-05-06 DIAGNOSIS — R569 Unspecified convulsions: Secondary | ICD-10-CM

## 2011-05-06 DIAGNOSIS — I10 Essential (primary) hypertension: Secondary | ICD-10-CM | POA: Insufficient documentation

## 2011-05-06 DIAGNOSIS — IMO0001 Reserved for inherently not codable concepts without codable children: Secondary | ICD-10-CM

## 2011-05-06 DIAGNOSIS — Z789 Other specified health status: Secondary | ICD-10-CM

## 2011-05-06 DIAGNOSIS — Z973 Presence of spectacles and contact lenses: Secondary | ICD-10-CM | POA: Insufficient documentation

## 2011-05-06 NOTE — Patient Instructions (Signed)
Continue light activities. 

## 2011-05-06 NOTE — Progress Notes (Signed)
Subjective:     Patient ID: [name], female   DOB: 01-Aug-1946, 65 y.o.   MRN: [id number]    BP 118/78  Pulse 76  Temp(Src) 96.8 F (36 C) (Temporal)  Ht 5\' 5"  (1.651 m)  Wt 137 lb 3.2 oz (62.234 kg)  BMI 22.83 kg/m2    HPI She is here for another postoperative visit. She is taking Colace and is having bowel movements. She is getting a little more sore and she's been walking more. Her strength is slowly coming back.  Review of Systems     Objective:   Physical Exam Abdomen: Lower midline incision is clean and intact and solid. The right groin incision is clean intact and solid with minimal swelling.    Assessment:       Status post emergency repair of strangulated right inguinal hernia with right colectomy through a lower midline incision and hernia repair to right groin incision. She is slowly improving. I do not think she is ready to back to work or do heavy activities yet. Plan:     Continue light activities. Keep her out of work for at least 3 or 4 more weeks. I will see her back at that time to determine if she can resume normal activities and go back to work.

## 2011-06-02 ENCOUNTER — Encounter (INDEPENDENT_AMBULATORY_CARE_PROVIDER_SITE_OTHER): Payer: Self-pay | Admitting: General Surgery

## 2011-06-03 ENCOUNTER — Ambulatory Visit (INDEPENDENT_AMBULATORY_CARE_PROVIDER_SITE_OTHER): Payer: 59 | Admitting: General Surgery

## 2011-06-03 DIAGNOSIS — K4030 Unilateral inguinal hernia, with obstruction, without gangrene, not specified as recurrent: Secondary | ICD-10-CM | POA: Insufficient documentation

## 2011-06-03 NOTE — Progress Notes (Signed)
She is here for a postop visit. Bowels are moving. Energy level is improving. Abdominal swelling is decreasing.  Physical exam: The abdomen is soft, midline incision and right groin incision is clean and intact. No hernias present.  Assessment: She is improving overall.  Plan: Start resuming activities as tolerated. He may return to work although she may start out part time. Return visit p.r.n.

## 2011-06-03 NOTE — Patient Instructions (Signed)
May return to work, part or full time.  Activities as tolerated.

## 2011-09-08 ENCOUNTER — Other Ambulatory Visit (HOSPITAL_COMMUNITY): Payer: Self-pay | Admitting: Family Medicine

## 2011-09-08 DIAGNOSIS — Z1231 Encounter for screening mammogram for malignant neoplasm of breast: Secondary | ICD-10-CM

## 2011-10-17 ENCOUNTER — Ambulatory Visit (HOSPITAL_COMMUNITY)
Admission: RE | Admit: 2011-10-17 | Discharge: 2011-10-17 | Disposition: A | Discharge status: Home / Self Care | Payer: 59 | Source: Ambulatory Visit | Attending: Family Medicine | Admitting: Family Medicine

## 2011-10-17 DIAGNOSIS — Z1231 Encounter for screening mammogram for malignant neoplasm of breast: Secondary | ICD-10-CM | POA: Insufficient documentation

## 2012-11-12 ENCOUNTER — Emergency Department (HOSPITAL_COMMUNITY)
Admission: EM | Admit: 2012-11-12 | Discharge: 2012-11-12 | Disposition: A | Discharge status: Home / Self Care | Payer: Medicare Other | Attending: Emergency Medicine | Admitting: Emergency Medicine

## 2012-11-12 ENCOUNTER — Encounter (HOSPITAL_COMMUNITY): Payer: Self-pay | Admitting: Emergency Medicine

## 2012-11-12 DIAGNOSIS — S61219A Laceration without foreign body of unspecified finger without damage to nail, initial encounter: Secondary | ICD-10-CM

## 2012-11-12 DIAGNOSIS — W268XXA Contact with other sharp object(s), not elsewhere classified, initial encounter: Secondary | ICD-10-CM | POA: Insufficient documentation

## 2012-11-12 DIAGNOSIS — S61209A Unspecified open wound of unspecified finger without damage to nail, initial encounter: Secondary | ICD-10-CM | POA: Insufficient documentation

## 2012-11-12 DIAGNOSIS — I10 Essential (primary) hypertension: Secondary | ICD-10-CM | POA: Insufficient documentation

## 2012-11-12 DIAGNOSIS — R55 Syncope and collapse: Secondary | ICD-10-CM | POA: Insufficient documentation

## 2012-11-12 DIAGNOSIS — Z7982 Long term (current) use of aspirin: Secondary | ICD-10-CM | POA: Insufficient documentation

## 2012-11-12 DIAGNOSIS — Z79899 Other long term (current) drug therapy: Secondary | ICD-10-CM | POA: Insufficient documentation

## 2012-11-12 DIAGNOSIS — E785 Hyperlipidemia, unspecified: Secondary | ICD-10-CM | POA: Insufficient documentation

## 2012-11-12 DIAGNOSIS — Z860100 Personal history of colon polyps, unspecified: Secondary | ICD-10-CM | POA: Insufficient documentation

## 2012-11-12 DIAGNOSIS — Y9289 Other specified places as the place of occurrence of the external cause: Secondary | ICD-10-CM | POA: Insufficient documentation

## 2012-11-12 DIAGNOSIS — Y9389 Activity, other specified: Secondary | ICD-10-CM | POA: Insufficient documentation

## 2012-11-12 DIAGNOSIS — Z8719 Personal history of other diseases of the digestive system: Secondary | ICD-10-CM | POA: Insufficient documentation

## 2012-11-12 LAB — CBC WITH DIFFERENTIAL/PLATELET
Basophils Absolute: 0 10*3/uL (ref 0.0–0.1)
Basophils Relative: 0 % (ref 0–1)
Eosinophils Absolute: 0 10*3/uL (ref 0.0–0.7)
Eosinophils Relative: 0 % (ref 0–5)
HCT: 37.5 % (ref 36.0–46.0)
Hemoglobin: 12.8 g/dL (ref 12.0–15.0)
Lymphocytes Relative: 4 % — ABNORMAL LOW (ref 12–46)
Lymphs Abs: 0.3 10*3/uL — ABNORMAL LOW (ref 0.7–4.0)
MCH: 28.6 pg (ref 26.0–34.0)
MCHC: 34.1 g/dL (ref 30.0–36.0)
MCV: 83.9 fL (ref 78.0–100.0)
Monocytes Absolute: 0.5 10*3/uL (ref 0.1–1.0)
Monocytes Relative: 9 % (ref 3–12)
Neutro Abs: 5.4 10*3/uL (ref 1.7–7.7)
Neutrophils Relative %: 87 % — ABNORMAL HIGH (ref 43–77)
Platelets: 186 10*3/uL (ref 150–400)
RBC: 4.47 MIL/uL (ref 3.87–5.11)
RDW: 13.2 % (ref 11.5–15.5)
WBC: 6.2 10*3/uL (ref 4.0–10.5)

## 2012-11-12 LAB — BASIC METABOLIC PANEL
BUN: 8 mg/dL (ref 6–23)
CO2: 24 mEq/L (ref 19–32)
Calcium: 8.4 mg/dL (ref 8.4–10.5)
Chloride: 97 mEq/L (ref 96–112)
Creatinine, Ser: 0.62 mg/dL (ref 0.50–1.10)
GFR calc Af Amer: >90 mL/min (ref >90)
GFR calc non Af Amer: >90 mL/min (ref >90)
Glucose, Bld: 102 mg/dL — ABNORMAL HIGH (ref 70–99)
Potassium: 3.2 mEq/L — ABNORMAL LOW (ref 3.5–5.1)
Sodium: 133 mEq/L — ABNORMAL LOW (ref 135–145)

## 2012-11-12 LAB — GLUCOSE, CAPILLARY: Glucose-Capillary: 90 mg/dL (ref 70–99)

## 2012-11-12 LAB — TROPONIN I: Troponin I: <0.3 ng/mL (ref <0.30)

## 2012-11-12 MED ORDER — TETANUS-DIPHTH-ACELL PERTUSSIS 5-2.5-18.5 LF-MCG/0.5 IM SUSP
0.5000 mL | Freq: Once | INTRAMUSCULAR | Status: AC
  Administered 2012-11-12: 0.5 mL via INTRAMUSCULAR
  Filled 2012-11-12: qty 0.5

## 2012-11-12 MED ORDER — POTASSIUM CHLORIDE CRYS ER 20 MEQ PO TBCR
40.0000 meq | EXTENDED_RELEASE_TABLET | Freq: Once | ORAL | Status: AC
  Administered 2012-11-12: 40 meq via ORAL
  Filled 2012-11-12: qty 2

## 2012-11-12 NOTE — ED Notes (Signed)
ZOX:[id number]<AV> Expected date:11/12/12<BR> Expected time:12:24 PM<BR> Means of arrival:Ambulance<BR> Comments:<BR> Syncope/58yoF

## 2012-11-12 NOTE — ED Notes (Signed)
Per EMS: Pt was at work and had a near syncope event, grabbed a cart, and caught her fall, resulting in a right hand - ring finger skin tear. Pt initially did not want to seek treatment, however had another syncopal event witnessed by EMS,  pt lowered to floor, pt lost consciousness for about 15 seconds. Pt incontinent of bowel at scene. Pt reports history with near syncope events that have been related to bowel movements.

## 2012-11-12 NOTE — ED Provider Notes (Signed)
History     [name] with syncope. Pt has had similar episodes in the past when she has had bowel movement. 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 Shortly later felt lightheaded as if may pass out and lowered self to ground. Grabbed a metal cart bedside her to brace her self and cut finger. Tried getting up right away before symptoms resolved and this time actually had a brief LOC. Was helped to ground by bystander. Currently no complaints aside from finger lac. Denies preceding CP. Felt fine earlier in day. Unsure of her last tetanus.   CSN: [id number]  Arrival date & time 11/12/12  1239   First MD Initiated Contact with Patient 11/12/12 1314      Chief Complaint  Patient presents with  . Loss of Consciousness    (Consider location/radiation/quality/duration/timing/severity/associated sxs/prior treatment) HPI  Past Medical History  Diagnosis Date  . Hypertension   . Hyperlipidemia   . Hernia   . Ectopic pregnancy 1974  . Inguinal hernia unilateral, non-recurrent     right, strangulated-right colon  . Colon polyp     Past Surgical History  Procedure Date  . Ectopic pregnancy surgery 1974  . Tonsilectomy, adenoidectomy, bilateral myringotomy and tubes 1950  . Hernia repair 2012    right inguinal  . Colon surgery     right colectomy    Family History  Problem Relation Age of Onset  . Heart disease Mother   . Cancer Father     melanoma    History  Substance Use Topics  . Smoking status: Never Smoker   . Smokeless tobacco: Not on file  . Alcohol Use: 0.6 oz/week    1 Glasses of wine per week     Comment: socially    OB History    Grav Para Term Preterm Abortions TAB SAB Ect Mult Living                  Review of Systems  All systems reviewed and negative, other than as noted in HPI.   Allergies  Promethazine hcl  Home Medications   Current Outpatient Rx  Name  Route  Sig  Dispense  Refill  . ACETAMINOPHEN 500 MG PO  TABS   Oral   Take 500 mg by mouth every 6 (six) hours as needed. For pain.         . ASPIRIN 81 MG PO TABS   Oral   Take 81 mg by mouth daily. Not taking due to surgery.         . ATORVASTATIN CALCIUM 10 MG PO TABS   Oral   Take 10 mg by mouth every other day.         Marland Kitchen CALCIUM CITRATE-VITAMIN D 200-200 MG-UNIT PO TABS   Oral   Take 1 tablet by mouth daily.          Marland Kitchen CLONAZEPAM 1 MG PO TABS   Oral   Take 1 mg by mouth 3 (three) times daily as needed. For anxiety.         . OMEGA-3 FATTY ACIDS 1000 MG PO CAPS   Oral   Take 2 g by mouth daily.           Marland Kitchen FLUTICASONE PROPIONATE 50 MCG/ACT NA SUSP   Nasal   Place 2 sprays into the nose daily.         Marland Kitchen HYDROCHLOROTHIAZIDE 25 MG PO TABS   Oral   Take  12.5 mg by mouth daily.          Marland Kitchen MELATONIN 5 MG PO TABS   Oral   Take 5 mg by mouth at bedtime.          Marland Kitchen METOPROLOL SUCCINATE ER 50 MG PO TB24   Oral   Take 50 mg by mouth daily.           . ADULT MULTIVITAMIN W/MINERALS CH   Oral   Take 1 tablet by mouth daily.         Marland Kitchen ONDANSETRON HCL 4 MG PO TABS   Oral   Take 4 mg by mouth every 8 (eight) hours as needed. For nausea.           BP 130/74  Pulse 100  Temp 97.5 F (36.4 C) (Oral)  Resp 16  SpO2 98%  Physical Exam  Nursing note and vitals reviewed. Constitutional: She appears well-developed and well-nourished. No distress.  HENT:  Head: Normocephalic and atraumatic.  Eyes: Conjunctivae normal are normal. Right eye exhibits no discharge. Left eye exhibits no discharge.  Neck: Neck supple.  Cardiovascular: Normal rate, regular rhythm and normal heart sounds.  Exam reveals no gallop and no friction rub.   No murmur heard. Pulmonary/Chest: Effort normal and breath sounds normal. No respiratory distress.  Abdominal: Soft. She exhibits no distension. There is no tenderness.  Musculoskeletal: She exhibits no edema and no tenderness.  Neurological: She is alert.  Skin: Skin is warm  and dry.       Flapped laceration to the right ring finger. Palmar aspect of her middle phalanx. The subcutaneous fat exposed. No active bleeding. No evidence of tendon injury. Patient is able to flex against resistance. Sensation is intact distally. Cap refill less than 2 seconds distally.  Psychiatric: She has a normal mood and affect. Her behavior is normal. Thought content normal.    ED Course  Procedures (including critical care time)  LACERATION REPAIR Performed by: Raeford Razor Authorized by: Raeford Razor Consent: Verbal consent obtained. Risks and benefits: risks, benefits and alternatives were discussed Consent given by: patient Patient identity confirmed: provided demographic data Prepped and Draped in normal sterile fashion Wound explored  Laceration Location: Right ring finger  Laceration Length: 2 cm in total length   No Foreign Bodies seen or palpated  Anesthesia: local infiltration  Local anesthetic: lidocaine 2% w/ epinephrine  Anesthetic total: 2 ml  Irrigation method: syringe  Amount of cleaning: standard  Skin closure: single layer  Number of sutures: 4  Technique: simple interrupted  Patient tolerance: Patient tolerated the procedure well with no immediate complications.  Labs Reviewed  CBC WITH DIFFERENTIAL - Abnormal; Notable for the following:    Neutrophils Relative 87 (*)     Lymphocytes Relative 4 (*)     Lymphs Abs 0.3 (*)     All other components within normal limits  BASIC METABOLIC PANEL - Abnormal; Notable for the following:    Sodium 133 (*)     Potassium 3.2 (*)     Glucose, Bld 102 (*)     All other components within normal limits  TROPONIN I  GLUCOSE, CAPILLARY  LAB REPORT - SCANNED   No results found.  EKG:  Rhythm: Normal sinus rhythm Rate: 80 Intervals/conduction: Normal Axis: Normal ST segments: Nonspecific ST changes inferiorly   1. Syncope   2. Finger laceration       MDM  67 year old female with a  syncopal event. This seems to be  very close related to the urge to defecate. Patient has a history of this. No concerning history otherwise. EKG with normal intervals. No significant for mild hypokalemia which was supplemented. Finger laceration which was repaired. Tetanus updated. Plan continued wound care and outpatient followup for suture removal. Emergent return cautions were discussed.       Raeford Razor, MD 11/15/12 1245

## 2014-02-13 ENCOUNTER — Other Ambulatory Visit: Payer: Self-pay | Admitting: Family Medicine

## 2014-02-13 ENCOUNTER — Ambulatory Visit
Admission: RE | Admit: 2014-02-13 | Discharge: 2014-02-13 | Disposition: A | Discharge status: Home / Self Care | Payer: 59 | Source: Ambulatory Visit | Attending: Family Medicine | Admitting: Family Medicine

## 2014-02-13 DIAGNOSIS — S6991XA Unspecified injury of right wrist, hand and finger(s), initial encounter: Secondary | ICD-10-CM

## 2015-08-01 ENCOUNTER — Other Ambulatory Visit: Payer: Self-pay | Admitting: Family Medicine

## 2015-08-01 DIAGNOSIS — R109 Unspecified abdominal pain: Secondary | ICD-10-CM

## 2015-08-01 DIAGNOSIS — R634 Abnormal weight loss: Secondary | ICD-10-CM

## 2015-08-02 ENCOUNTER — Ambulatory Visit
Admission: RE | Admit: 2015-08-02 | Discharge: 2015-08-02 | Disposition: A | Discharge status: Home / Self Care | Payer: 59 | Source: Ambulatory Visit | Attending: Family Medicine | Admitting: Family Medicine

## 2015-08-02 DIAGNOSIS — R634 Abnormal weight loss: Secondary | ICD-10-CM

## 2015-08-02 DIAGNOSIS — R109 Unspecified abdominal pain: Secondary | ICD-10-CM

## 2015-08-02 MED ORDER — IOPAMIDOL (ISOVUE-300) INJECTION 61%
100.0000 mL | Freq: Once | INTRAVENOUS | Status: AC | PRN
  Administered 2015-08-02: 100 mL via INTRAVENOUS

## 2015-08-06 ENCOUNTER — Encounter (HOSPITAL_COMMUNITY): Payer: Self-pay

## 2015-08-06 ENCOUNTER — Inpatient Hospital Stay (HOSPITAL_COMMUNITY)
Admission: AD | Admit: 2015-08-06 | Discharge: 2015-08-13 | DRG: 330 | Disposition: A | Discharge status: Skilled Nursing Facility | Payer: 59 | Source: Ambulatory Visit | Attending: General Surgery | Admitting: General Surgery

## 2015-08-06 ENCOUNTER — Encounter: Payer: Self-pay | Admitting: General Surgery

## 2015-08-06 ENCOUNTER — Inpatient Hospital Stay (HOSPITAL_COMMUNITY): Payer: 59

## 2015-08-06 DIAGNOSIS — K219 Gastro-esophageal reflux disease without esophagitis: Secondary | ICD-10-CM | POA: Diagnosis present

## 2015-08-06 DIAGNOSIS — Z7982 Long term (current) use of aspirin: Secondary | ICD-10-CM

## 2015-08-06 DIAGNOSIS — I10 Essential (primary) hypertension: Secondary | ICD-10-CM | POA: Diagnosis present

## 2015-08-06 DIAGNOSIS — Z79899 Other long term (current) drug therapy: Secondary | ICD-10-CM

## 2015-08-06 DIAGNOSIS — K5669 Other intestinal obstruction: Principal | ICD-10-CM | POA: Diagnosis present

## 2015-08-06 DIAGNOSIS — Z8249 Family history of ischemic heart disease and other diseases of the circulatory system: Secondary | ICD-10-CM | POA: Diagnosis not present

## 2015-08-06 DIAGNOSIS — E7800 Pure hypercholesterolemia, unspecified: Secondary | ICD-10-CM | POA: Diagnosis present

## 2015-08-06 DIAGNOSIS — K56609 Unspecified intestinal obstruction, unspecified as to partial versus complete obstruction: Secondary | ICD-10-CM | POA: Diagnosis present

## 2015-08-06 DIAGNOSIS — K5792 Diverticulitis of intestine, part unspecified, without perforation or abscess without bleeding: Secondary | ICD-10-CM | POA: Diagnosis present

## 2015-08-06 DIAGNOSIS — R140 Abdominal distension (gaseous): Secondary | ICD-10-CM | POA: Diagnosis present

## 2015-08-06 LAB — CBC
HCT: 43.9 % (ref 36.0–46.0)
HEMOGLOBIN: 14.7 g/dL (ref 12.0–15.0)
MCH: 29.1 pg (ref 26.0–34.0)
MCHC: 33.5 g/dL (ref 30.0–36.0)
MCV: 86.8 fL (ref 78.0–100.0)
PLATELETS: 369 10*3/uL (ref 150–400)
RBC: 5.06 MIL/uL (ref 3.87–5.11)
RDW: 13.5 % (ref 11.5–15.5)
WBC: 9 10*3/uL (ref 4.0–10.5)

## 2015-08-06 LAB — COMPREHENSIVE METABOLIC PANEL
ALBUMIN: 3.9 g/dL (ref 3.5–5.0)
ALT: 17 U/L (ref 14–54)
ANION GAP: 9 (ref 5–15)
AST: 24 U/L (ref 15–41)
Alkaline Phosphatase: 69 U/L (ref 38–126)
BUN: 7 mg/dL (ref 6–20)
CALCIUM: 9.1 mg/dL (ref 8.9–10.3)
CHLORIDE: 98 mmol/L — AB (ref 101–111)
CO2: 29 mmol/L (ref 22–32)
Creatinine, Ser: 0.71 mg/dL (ref 0.44–1.00)
GFR calc non Af Amer: >60 mL/min (ref >60)
GLUCOSE: 100 mg/dL — AB (ref 65–99)
POTASSIUM: 2.9 mmol/L — AB (ref 3.5–5.1)
SODIUM: 136 mmol/L (ref 135–145)
Total Bilirubin: 1 mg/dL (ref 0.3–1.2)
Total Protein: 7 g/dL (ref 6.5–8.1)

## 2015-08-06 LAB — PROTIME-INR
INR: 0.99 (ref 0.00–1.49)
Prothrombin Time: 13.3 seconds (ref 11.6–15.2)

## 2015-08-06 MED ORDER — ONDANSETRON 4 MG PO TBDP: 4.0000 mg | ORAL_TABLET | Freq: Four times a day (QID) | ORAL | Status: DC | PRN

## 2015-08-06 MED ORDER — ONDANSETRON HCL 4 MG/2ML IJ SOLN
4.0000 mg | Freq: Four times a day (QID) | INTRAMUSCULAR | Status: DC | PRN
  Filled 2015-08-06: qty 2

## 2015-08-06 MED ORDER — PANTOPRAZOLE SODIUM 40 MG IV SOLR
40.0000 mg | Freq: Every day | INTRAVENOUS | Status: DC
  Administered 2015-08-06 – 2015-08-12 (×7): 40 mg via INTRAVENOUS
  Filled 2015-08-06 (×8): qty 40

## 2015-08-06 MED ORDER — BISACODYL 10 MG RE SUPP
10.0000 mg | RECTAL | Status: AC
  Administered 2015-08-06 (×2): 10 mg via RECTAL
  Filled 2015-08-06 (×2): qty 1

## 2015-08-06 MED ORDER — METOPROLOL SUCCINATE ER 50 MG PO TB24
50.0000 mg | ORAL_TABLET | Freq: Every day | ORAL | Status: DC
  Administered 2015-08-06 – 2015-08-07 (×2): 50 mg via ORAL
  Filled 2015-08-06 (×2): qty 1

## 2015-08-06 MED ORDER — DEXTROSE-NACL 5-0.9 % IV SOLN
INTRAVENOUS | Status: DC
  Administered 2015-08-06 – 2015-08-07 (×2): via INTRAVENOUS

## 2015-08-06 NOTE — Progress Notes (Signed)
Telephone call critical report from radiologist to Dr. Zella Richer that the patient's cecum is measuring 14.8 cm and at high risk for perforation.

## 2015-08-06 NOTE — Progress Notes (Signed)
GI: flex sig planned for 10 AM tomorrow

## 2015-08-06 NOTE — H&P (Signed)
HISTORY AND PHYSICAL  [name] 08/06/2015 11:21 AM Location: [facility] Patient #: [id number] DOB: [birth year] Divorced / Language: Cleophus Molt / Race: White Female History of Present Illness Emily Hollingshead MD; 08/06/2015 11:48 AM) The patient is a 69 year old female    Note:She is referred to our office urgently by Dr. 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 She's been having abdominal distension on and off for 2 months she states. She has been eating less and has lost 20 pounds. She has occasional nausea. She is not passing much gas and only passes small amounts of liquid stool. A CT scan of the abdomen and pelvis demonstrates this high-grade stricture which they feel is secondary the level of a suture line however she's had a partial right colectomy in the past and no surgery, to my or her knowledge, on her sigmoid colon or rectum. She did have emergency surgery for an ectopic pregnancy in the distant past.  Other Problems Marjean Donna, CMA; 08/06/2015 11:21 AM) Anxiety Disorder Depression Diverticulosis Gastroesophageal Reflux Disease High blood pressure Hypercholesterolemia Inguinal Hernia Oophorectomy Right. Transfusion history  Past Surgical History Marjean Donna, [place]; 08/06/2015 11:21 AM) Breast Biopsy Left. Laparoscopic Inguinal Hernia Surgery Right. Resection of Stomach Tonsillectomy  Diagnostic Studies History Marjean Donna, CMA; 08/06/2015 11:21 AM) Colonoscopy >10 years ago Mammogram 1-3 years ago Pap Smear 1-5 years ago  Allergies Marjean Donna, CMA; 08/06/2015 11:22 AM) No Known Drug Allergies 08/06/2015  Medication History (Jaysin Gayler Bynum, CMA; 08/06/2015 11:23 AM) ClonazePAM (1MG  Tablet, Oral) Active. Atorvastatin Calcium (10MG  Tablet, Oral) Active. Metoprolol Succinate ER (50MG  Tablet ER 24HR, Oral) Active. Hydrochlorothiazide (25MG  Tablet, Oral)  Active. Ondansetron HCl (4MG  Tablet, Oral) Active. Vitamin D (Cholecalciferol) (1000UNIT Capsule, Oral) Active. Biotin (1MG  Capsule, Oral) Active. Flonase (50MCG/ACT Suspension, Nasal as needed) Active. Aspirin (81MG  Tablet Chewable, Oral) Active. Medications Reconciled  Social History Marjean Donna, CMA; 08/06/2015 11:21 AM) Alcohol use Occasional alcohol use. Caffeine use Carbonated beverages, Coffee, Tea. No drug use  Family History Marjean Donna, CMA; 08/06/2015 11:21 AM) Arthritis Father. Depression Mother, Sister. Heart Disease Mother. Hypertension Father, Mother. Ischemic Bowel Disease Father.  Pregnancy / Birth History Marjean Donna, [place]; 08/06/2015 11:22 AM) Age at menarche 55 years. Age of menopause 51-55 Contraceptive History Oral contraceptives. Gravida 2 Maternal age 14-30 Para 1     Review of Systems ([place]; 08/06/2015 11:22 AM) General Present- Appetite Loss and Weight Loss. Not Present- Chills, Fatigue, Fever, Night Sweats and Weight Gain. Skin Present- Change in Wart/Mole. Not Present- Dryness, Hives, Jaundice, New Lesions, Non-Healing Wounds, Rash and Ulcer. HEENT Present- Seasonal Allergies and Wears glasses/contact lenses. Not Present- Earache, Hearing Loss, Hoarseness, Nose Bleed, Oral Ulcers, Ringing in the Ears, Sinus Pain, Sore Throat, Visual Disturbances and Yellow Eyes. Gastrointestinal Present- Abdominal Pain, Change in Bowel Habits and Gets full quickly at meals. Not Present- Bloating, Bloody Stool, Chronic diarrhea, Constipation, Difficulty Swallowing, Excessive gas, Hemorrhoids, Indigestion, Nausea, Rectal Pain and Vomiting. Female Genitourinary Not Present- Frequency, Nocturia, Painful Urination, Pelvic Pain and Urgency. Musculoskeletal Not Present- Back Pain, Joint Pain, Joint Stiffness, Muscle Pain, Muscle Weakness and Swelling of Extremities. Neurological Not Present- Decreased Memory, Fainting, Headaches, Numbness,  Seizures, Tingling, Tremor, Trouble walking and Weakness. Psychiatric Present- Anxiety. Not Present- Bipolar, Change in Sleep Pattern, Depression, Fearful and Frequent crying. Endocrine Not Present- Cold Intolerance, Excessive Hunger, Hair Changes, Heat Intolerance, Hot flashes and New Diabetes. Hematology Not Present- Easy Bruising, Excessive bleeding, Gland  problems, HIV and Persistent Infections.   Physical Exam Emily Hollingshead MD; 08/06/2015 11:55 AM)  The physical exam findings are as follows: Note:General: Thin female in NAD. Pleasant and cooperative.  HEENT: [place]/AT, no facial masses  EYES: EOMI, no icterus  CV: RRR, no murmur, no JVD.  CHEST: Breath sounds equal and clear. Respirations nonlabored.  ABDOMEN: Soft, massively distended with visible bowel loops underneath the skin, lower midline incision and right groin incision present. High-pitched rushing bowel sounds heard.  ANORECTAL: No fissures. Slightly decreased sphincter tone. No palpable masses. Soft brown stool present.  GU: Right groin scar  MUSCULOSKELETAL: FROM, good muscle tone, no edema, distal venous stasis changes noted bilaterally  SKIN: No jaundice or suspicious rashes.  NEUROLOGIC: Alert and oriented, answers questions appropriately, normal gait and station.  PSYCHIATRIC: Normal mood, affect , and behavior.    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 Etiology is unclear but could be scarring from diverticular disease or potentially a tumor.  Plan: Admission to the hospital now. Urgent gastroenterology consultation for colonoscopy-I have spoken with Dr. Amedeo Plenty about this.. Likely will need a surgical procedure during the admission as well.

## 2015-08-06 NOTE — Progress Notes (Signed)
HISTORY AND PHYSICAL  Nayra P. Almas 08/06/2015 11:21 AM Location: [facility] Patient #: [id number] DOB: [birth year] Divorced / Language: Cleophus Molt / Race: White Female History of Present Illness Odis Hollingshead MD; 08/06/2015 11:48 AM) The patient is a 69 year old female    Note:She is referred to our office urgently by Dr. 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 She's been having abdominal distension on and off for 2 months she states. She has been eating less and has lost 20 pounds. She has occasional nausea. She is not passing much gas and only passes small amounts of liquid stool. A CT scan of the abdomen and pelvis demonstrates this high-grade stricture which they feel is secondary the level of a suture line however she's had a partial right colectomy in the past and no surgery, to my or her knowledge, on her sigmoid colon or rectum. She did have emergency surgery for an ectopic pregnancy in the distant past.  Other Problems Marjean Donna, CMA; 08/06/2015 11:21 AM) Anxiety Disorder Depression Diverticulosis Gastroesophageal Reflux Disease High blood pressure Hypercholesterolemia Inguinal Hernia Oophorectomy Right. Transfusion history  Past Surgical History Marjean Donna, [place]; 08/06/2015 11:21 AM) Breast Biopsy Left. Laparoscopic Inguinal Hernia Surgery Right. Resection of Stomach Tonsillectomy  Diagnostic Studies History Marjean Donna, CMA; 08/06/2015 11:21 AM) Colonoscopy >10 years ago Mammogram 1-3 years ago Pap Smear 1-5 years ago  Allergies Marjean Donna, CMA; 08/06/2015 11:22 AM) No Known Drug Allergies 08/06/2015  Medication History (Sonya Bynum, CMA; 08/06/2015 11:23 AM) ClonazePAM (1MG  Tablet, Oral) Active. Atorvastatin Calcium (10MG  Tablet, Oral) Active. Metoprolol Succinate ER (50MG  Tablet ER 24HR, Oral) Active. Hydrochlorothiazide (25MG  Tablet, Oral)  Active. Ondansetron HCl (4MG  Tablet, Oral) Active. Vitamin D (Cholecalciferol) (1000UNIT Capsule, Oral) Active. Biotin (1MG  Capsule, Oral) Active. Flonase (50MCG/ACT Suspension, Nasal as needed) Active. Aspirin (81MG  Tablet Chewable, Oral) Active. Medications Reconciled  Social History Marjean Donna, CMA; 08/06/2015 11:21 AM) Alcohol use Occasional alcohol use. Caffeine use Carbonated beverages, Coffee, Tea. No drug use  Family History Marjean Donna, CMA; 08/06/2015 11:21 AM) Arthritis Father. Depression Mother, Sister. Heart Disease Mother. Hypertension Father, Mother. Ischemic Bowel Disease Father.  Pregnancy / Birth History Marjean Donna, [place]; 08/06/2015 11:22 AM) Age at menarche 69 years. Age of menopause 51-55 Contraceptive History Oral contraceptives. Gravida 2 Maternal age 31-30 Para 1     Review of Systems ([place]; 08/06/2015 11:22 AM) General Present- Appetite Loss and Weight Loss. Not Present- Chills, Fatigue, Fever, Night Sweats and Weight Gain. Skin Present- Change in Wart/Mole. Not Present- Dryness, Hives, Jaundice, New Lesions, Non-Healing Wounds, Rash and Ulcer. HEENT Present- Seasonal Allergies and Wears glasses/contact lenses. Not Present- Earache, Hearing Loss, Hoarseness, Nose Bleed, Oral Ulcers, Ringing in the Ears, Sinus Pain, Sore Throat, Visual Disturbances and Yellow Eyes. Gastrointestinal Present- Abdominal Pain, Change in Bowel Habits and Gets full quickly at meals. Not Present- Bloating, Bloody Stool, Chronic diarrhea, Constipation, Difficulty Swallowing, Excessive gas, Hemorrhoids, Indigestion, Nausea, Rectal Pain and Vomiting. Female Genitourinary Not Present- Frequency, Nocturia, Painful Urination, Pelvic Pain and Urgency. Musculoskeletal Not Present- Back Pain, Joint Pain, Joint Stiffness, Muscle Pain, Muscle Weakness and Swelling of Extremities. Neurological Not Present- Decreased Memory, Fainting, Headaches, Numbness,  Seizures, Tingling, Tremor, Trouble walking and Weakness. Psychiatric Present- Anxiety. Not Present- Bipolar, Change in Sleep Pattern, Depression, Fearful and Frequent crying. Endocrine Not Present- Cold Intolerance, Excessive Hunger, Hair Changes, Heat Intolerance, Hot flashes and New Diabetes. Hematology Not Present- Easy Bruising, Excessive bleeding, Gland  problems, HIV and Persistent Infections.   Physical Exam Odis Hollingshead MD; 08/06/2015 11:55 AM)  The physical exam findings are as follows: Note:General: Thin female in NAD. Pleasant and cooperative.  HEENT: [place]/AT, no facial masses  EYES: EOMI, no icterus  CV: RRR, no murmur, no JVD.  CHEST: Breath sounds equal and clear. Respirations nonlabored.  ABDOMEN: Soft, massively distended with visible bowel loops underneath the skin, lower midline incision and right groin incision present. High-pitched rushing bowel sounds heard.  ANORECTAL: No fissures. Slightly decreased sphincter tone. No palpable masses. Soft brown stool present.  GU: Right groin scar  MUSCULOSKELETAL: FROM, good muscle tone, no edema, distal venous stasis changes noted bilaterally  SKIN: No jaundice or suspicious rashes.  NEUROLOGIC: Alert and oriented, answers questions appropriately, normal gait and station.  PSYCHIATRIC: Normal mood, affect , and behavior.    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 Etiology is unclear but could be scarring from diverticular disease or potentially a tumor.  Plan: Admission to the hospital now. Urgent gastroenterology consultation for colonoscopy-I have spoken with Dr. Amedeo Plenty about this.. Likely will need a surgical procedure during the admission as well. We discussed the plan and she is in agreement with it.  Jackolyn Confer, MD

## 2015-08-07 ENCOUNTER — Encounter (HOSPITAL_COMMUNITY): Admission: AD | Disposition: A | Discharge status: Skilled Nursing Facility | Payer: Self-pay | Source: Ambulatory Visit

## 2015-08-07 ENCOUNTER — Encounter (HOSPITAL_COMMUNITY): Payer: Self-pay

## 2015-08-07 HISTORY — PX: FLEXIBLE SIGMOIDOSCOPY: SHX5431

## 2015-08-07 LAB — MAGNESIUM: MAGNESIUM: 1.7 mg/dL (ref 1.7–2.4)

## 2015-08-07 SURGERY — SIGMOIDOSCOPY, FLEXIBLE
Anesthesia: Moderate Sedation

## 2015-08-07 MED ORDER — FENTANYL CITRATE (PF) 100 MCG/2ML IJ SOLN
INTRAMUSCULAR | Status: AC
  Filled 2015-08-07: qty 2

## 2015-08-07 MED ORDER — FENTANYL CITRATE (PF) 100 MCG/2ML IJ SOLN
INTRAMUSCULAR | Status: DC | PRN
  Administered 2015-08-07 (×3): 25 ug via INTRAVENOUS

## 2015-08-07 MED ORDER — MIDAZOLAM HCL 5 MG/ML IJ SOLN
INTRAMUSCULAR | Status: AC
  Filled 2015-08-07: qty 1

## 2015-08-07 MED ORDER — CEFOTETAN DISODIUM 2 G IJ SOLR
2.0000 g | INTRAMUSCULAR | Status: AC
  Administered 2015-08-08: 2 g via INTRAVENOUS
  Filled 2015-08-07: qty 2

## 2015-08-07 MED ORDER — METOPROLOL TARTRATE 1 MG/ML IV SOLN
5.0000 mg | Freq: Four times a day (QID) | INTRAVENOUS | Status: DC | PRN
  Filled 2015-08-07: qty 5

## 2015-08-07 MED ORDER — SODIUM CHLORIDE 0.9 % IV SOLN
INTRAVENOUS | Status: DC
  Administered 2015-08-07: 500 mL via INTRAVENOUS

## 2015-08-07 MED ORDER — MIDAZOLAM HCL 10 MG/2ML IJ SOLN
INTRAMUSCULAR | Status: DC | PRN
  Administered 2015-08-07: 2 mg via INTRAVENOUS
  Administered 2015-08-07: 1 mg via INTRAVENOUS
  Administered 2015-08-07: 2 mg via INTRAVENOUS

## 2015-08-07 MED ORDER — KCL IN DEXTROSE-NACL 40-5-0.9 MEQ/L-%-% IV SOLN
INTRAVENOUS | Status: DC
  Administered 2015-08-07 – 2015-08-08 (×2): via INTRAVENOUS
  Filled 2015-08-07 (×4): qty 1000

## 2015-08-07 NOTE — Interval H&P Note (Signed)
History and Physical Interval Note:  08/07/2015 11:38 AM  [name]  has presented today for surgery, with the diagnosis of colonic obstruction  The various methods of treatment have been discussed with the patient and family. After consideration of risks, benefits and other options for treatment, the patient has consented to  Procedure(s): FLEXIBLE SIGMOIDOSCOPY (N/A) as a surgical intervention .  The patient's history has been reviewed, patient examined, no change in status, stable for surgery.  I have reviewed the patient's chart and labs.  Questions were answered to the patient's satisfaction.     HAYES,JOHN C

## 2015-08-07 NOTE — Progress Notes (Signed)
Patient ID: [name], female   DOB: [birth year], 69 y.o.   MRN: [id number]    Subjective: Pt feels ok.  No new complaints.  Been distended for about the last 2 months on and off.  Objective: Vital signs in last 24 hours: Temp:  [98 F (36.7 C)-98.4 F (36.9 C)] 98 F (36.7 C) (09/27 0602) Pulse Rate:  [67-83] 75 (09/27 0602) Resp:  [16] 16 (09/27 0602) BP: (128-151)/(76-90) 151/86 mmHg (09/27 0602) SpO2:  [97 %-98 %] 97 % (09/27 0602) Weight:  [62.234 kg (137 lb 3.2 oz)] 62.234 kg (137 lb 3.2 oz) (09/27 0000) Last BM Date: 08/07/15  Intake/Output from previous day: 09/26 0701 - 09/27 0700 In: 1200 [I.V.:1200] Out: 675 [Urine:675] Intake/Output this shift: Total I/O In: -  Out: 600 [Urine:600]  PE: Abd: distended, tight especially in her upper abdomen, +BS, not really tender Heart: regular Lungs: CTAB  Lab Results:   Recent Labs  08/06/15 1648  WBC 9.0  HGB 14.7  HCT 43.9  PLT 369   BMET  Recent Labs  08/06/15 1648  NA 136  K 2.9*  CL 98*  CO2 29  GLUCOSE 100*  BUN 7  CREATININE 0.71  CALCIUM 9.1   PT/INR  Recent Labs  08/06/15 1648  LABPROT 13.3  INR 0.99   CMP     Component Value Date/Time   NA 136 08/06/2015 1648   K 2.9* 08/06/2015 1648   CL 98* 08/06/2015 1648   CO2 29 08/06/2015 1648   GLUCOSE 100* 08/06/2015 1648   BUN 7 08/06/2015 1648   CREATININE 0.71 08/06/2015 1648   CALCIUM 9.1 08/06/2015 1648   PROT 7.0 08/06/2015 1648   ALBUMIN 3.9 08/06/2015 1648   AST 24 08/06/2015 1648   ALT 17 08/06/2015 1648   ALKPHOS 69 08/06/2015 1648   BILITOT 1.0 08/06/2015 1648   GFRNONAA >60 08/06/2015 1648   GFRAA >60 08/06/2015 1648   Lipase  No results found for: LIPASE     Studies/Results: Dg Abd Acute W/chest  08/06/2015   CLINICAL DATA:  Admitted to hospital today with severe abdominal pain, nausea, and abdominal distension which has been occurring off and on for 2 months, not passing gas, only passing small amount of liquid  stool, 20 lb weight loss over 2 months, anorexia  EXAM: DG ABDOMEN ACUTE W/ 1V CHEST  COMPARISON:  08/02/2015 CT abdomen and pelvis  FINDINGS: Normal heart size, mediastinal contours and pulmonary vascularity.  Emphysematous and bronchitic changes consistent with COPD.  Significant elevation of LEFT diaphragm.  Mild LEFT basilar atelectasis.  No definite infiltrate, pleural effusion or pneumothorax.  Diffuse osseous demineralization.  Significant gaseous distention of the colon with cecum up to 14.8 cm diameter.  No definite free intraperitoneal air identified.  Minimal small bowel gas dilatation.  No definite bowel wall thickening.  Bones severely demineralized with levoconvex thoracolumbar scoliosis and scattered degenerative changes.  IMPRESSION: Significant gaseous distention of the colon to the level of the sigmoid colon; prior CT exam demonstrated a rectosigmoid obstruction.  Cecum measures approximately 14.8 cm transverse diameter but no definite free intraperitoneal air is identified.  Findings called to [place] on 5W on 08/06/2015 at 1722 hour.   Electronically Signed   By: Lavonia Dana M.D.   On: 08/06/2015 17:24    Anti-infectives: Anti-infectives    None       Assessment/Plan  1. Colonic obstruction -flex sig today -anticipate surgery for colostomy tomorrow. -CT scan appears to have a  colorectal staple line and stricture here, but patient is only aware of a partial right colectomy done by Dr. Zella Richer secondary to an incarcerated right sided inguinal hernia.  She has had an ectopic pregnancy and is not sure if they did any resection at that time.   -appreciate GI assistance  LOS: 1 day    OSBORNE,KELLY E 08/07/2015, 9:03 AM Pager: [phone]

## 2015-08-07 NOTE — H&P (View-Only) (Signed)
GI: flex sig planned for 10 AM tomorrow

## 2015-08-07 NOTE — Consult Note (Signed)
WOC ostomy consult note Patient seen per Dr,. Marcello Moores' request for assessment of abdomen and preoperative stoma site selection. Her abdomen is assessed in the standing and sitting positions.  She has a palpable rectus muscle and many wrinkles and skin folds beneath the umbilicus.  Additionally there is a small ventral hernia in the supraumbilical field. Site selected is in the LUQ, 6.5cm to the left of the umbilicus and 1.2WP above.  Site is marked with a surgical skin marking pen and covered with a thin film transparent dressing.  Patient understands the role of the [place] nurse post operatively and has few questions regarding the surgery tomorrow.  She reminds me that she lives alone and that she would be interested in either Rehab or having a home health RN post acute care.  I reassure her that we will be discussing this with her post operatively and throughout her recovery time her at [facility]. [place] nursing team  will remain available to this patient, the nursing, srugical and medical teams.  Please re-consult post op if a stoma is created. Thanks, Maudie Flakes, MSN, RN, [place], [place], [place] [phone])

## 2015-08-07 NOTE — Op Note (Signed)
[facility] [place], [address]   FLEXIBLE SIGMOIDOSCOPY PROCEDURE REPORT  PATIENT: Felix, Pratt  MR#: [id number] BIRTHDATE: 12/18/1945 , 37  yrs. old GENDER: female ENDOSCOPIST: Teena Irani, MD REFERRED BY: PROCEDURE DATE:  08/07/2015 PROCEDURE: ASA CLASS: INDICATIONS:colonic obstruction with obstruction point at the rectosigmoid junction by CT scan MEDICATIONS: fentanyl 75 g, Versed 6 mg  DESCRIPTION OF PROCEDURE:   After the risks benefits and alternatives of the procedure were thoroughly explained, informed consent was obtained.     The     endoscope was introduced through the anus  and advanced to the    , The exam was Without limitations.    The quality of the prep was fair.      . Estimated blood loss is zero unless otherwise noted in this procedure report. The instrument was then slowly withdrawn as the mucosa was fully examined.       multiple diverticula in the rectosigmoid. Sharp angulation at 28 cm, unable to maintain a clear view of the lumen or perform a blind slide by due to resistance. No neoplasm or intrinsic inflammation or obvious fibrosis directly visualized. However based on inability to pass through this area suspected high-grade functional obstruction. The rectum and rectosigmoid mucosa were normal. The scope was then withdrawn from the patient and the procedure terminated.  COMPLICATIONS: There were no immediate complications.  ENDOSCOPIC IMPRESSION: diverticulosis,nondiagnostic procedure but suspect high-grade obstruction at the rectosigmoid junction , possibly compatible with diverticulitis or diverticular stricture. No neoplasm visualized.  RECOMMENDATIONS: consider surgical intervention unless rectal contrast study desired first.  REPEAT EXAM:  eSigned:  Teena Irani, MD 08/07/2015 12:01 PM   CC:

## 2015-08-08 ENCOUNTER — Encounter (HOSPITAL_COMMUNITY): Admission: AD | Disposition: A | Discharge status: Skilled Nursing Facility | Payer: Self-pay | Source: Ambulatory Visit

## 2015-08-08 ENCOUNTER — Encounter (HOSPITAL_COMMUNITY): Payer: Self-pay | Admitting: Certified Registered Nurse Anesthetist

## 2015-08-08 ENCOUNTER — Inpatient Hospital Stay (HOSPITAL_COMMUNITY): Payer: 59 | Admitting: Certified Registered Nurse Anesthetist

## 2015-08-08 HISTORY — PX: COLECTOMY WITH COLOSTOMY CREATION/HARTMANN PROCEDURE: SHX6598

## 2015-08-08 LAB — BASIC METABOLIC PANEL
Anion gap: 4 — ABNORMAL LOW (ref 5–15)
CALCIUM: 8.1 mg/dL — AB (ref 8.9–10.3)
CO2: 25 mmol/L (ref 22–32)
Chloride: 109 mmol/L (ref 101–111)
Creatinine, Ser: 0.47 mg/dL (ref 0.44–1.00)
GFR calc Af Amer: >60 mL/min (ref >60)
GLUCOSE: 109 mg/dL — AB (ref 65–99)
Potassium: 4.1 mmol/L (ref 3.5–5.1)
SODIUM: 138 mmol/L (ref 135–145)

## 2015-08-08 LAB — TYPE AND SCREEN
ABO/RH(D): O POS
ANTIBODY SCREEN: NEGATIVE

## 2015-08-08 LAB — CBC
HCT: 37.9 % (ref 36.0–46.0)
Hemoglobin: 12.5 g/dL (ref 12.0–15.0)
MCH: 29.3 pg (ref 26.0–34.0)
MCHC: 33 g/dL (ref 30.0–36.0)
MCV: 89 fL (ref 78.0–100.0)
PLATELETS: 271 10*3/uL (ref 150–400)
RBC: 4.26 MIL/uL (ref 3.87–5.11)
RDW: 13.8 % (ref 11.5–15.5)
WBC: 5.1 10*3/uL (ref 4.0–10.5)

## 2015-08-08 LAB — SURGICAL PCR SCREEN
MRSA, PCR: NEGATIVE
Staphylococcus aureus: NEGATIVE

## 2015-08-08 SURGERY — COLECTOMY, WITH COLOSTOMY CREATION
Anesthesia: General

## 2015-08-08 MED ORDER — LIDOCAINE HCL (CARDIAC) 20 MG/ML IV SOLN
INTRAVENOUS | Status: DC | PRN
  Administered 2015-08-08: 50 mg via INTRAVENOUS

## 2015-08-08 MED ORDER — NEOSTIGMINE METHYLSULFATE 10 MG/10ML IV SOLN
INTRAVENOUS | Status: DC | PRN
  Administered 2015-08-08: 3 mg via INTRAVENOUS

## 2015-08-08 MED ORDER — PHENYLEPHRINE HCL 10 MG/ML IJ SOLN
INTRAMUSCULAR | Status: AC
  Filled 2015-08-08: qty 1

## 2015-08-08 MED ORDER — LACTATED RINGERS IV SOLN
INTRAVENOUS | Status: DC
  Administered 2015-08-08: 13:00:00 via INTRAVENOUS
  Administered 2015-08-08: 1000 mL via INTRAVENOUS
  Administered 2015-08-08: 12:00:00 via INTRAVENOUS

## 2015-08-08 MED ORDER — 0.9 % SODIUM CHLORIDE (POUR BTL) OPTIME
TOPICAL | Status: DC | PRN
  Administered 2015-08-08: 1000 mL

## 2015-08-08 MED ORDER — FENTANYL CITRATE (PF) 250 MCG/5ML IJ SOLN
INTRAMUSCULAR | Status: AC
  Filled 2015-08-08: qty 25

## 2015-08-08 MED ORDER — METOPROLOL SUCCINATE ER 50 MG PO TB24
50.0000 mg | ORAL_TABLET | Freq: Every day | ORAL | Status: DC
  Administered 2015-08-09 – 2015-08-12 (×4): 50 mg via ORAL
  Filled 2015-08-08 (×6): qty 1

## 2015-08-08 MED ORDER — SUCCINYLCHOLINE CHLORIDE 20 MG/ML IJ SOLN
INTRAMUSCULAR | Status: DC | PRN
  Administered 2015-08-08: 100 mg via INTRAVENOUS

## 2015-08-08 MED ORDER — PROPOFOL 10 MG/ML IV BOLUS
INTRAVENOUS | Status: DC | PRN
  Administered 2015-08-08: 100 mg via INTRAVENOUS

## 2015-08-08 MED ORDER — ONDANSETRON HCL 4 MG/2ML IJ SOLN
INTRAMUSCULAR | Status: DC | PRN
  Administered 2015-08-08: 4 mg via INTRAVENOUS

## 2015-08-08 MED ORDER — FENTANYL CITRATE (PF) 100 MCG/2ML IJ SOLN
INTRAMUSCULAR | Status: AC
  Filled 2015-08-08: qty 2

## 2015-08-08 MED ORDER — HYDROMORPHONE HCL 1 MG/ML IJ SOLN
INTRAMUSCULAR | Status: AC
  Filled 2015-08-08: qty 1

## 2015-08-08 MED ORDER — LACTATED RINGERS IV SOLN
INTRAVENOUS | Status: AC
  Administered 2015-08-08: 1000 mL via INTRAVENOUS
  Administered 2015-08-09: 11:00:00 via INTRAVENOUS

## 2015-08-08 MED ORDER — CEFOTETAN DISODIUM-DEXTROSE 2-2.08 GM-% IV SOLR
INTRAVENOUS | Status: AC
  Filled 2015-08-08: qty 50

## 2015-08-08 MED ORDER — NEOSTIGMINE METHYLSULFATE 10 MG/10ML IV SOLN
INTRAVENOUS | Status: AC
  Filled 2015-08-08: qty 1

## 2015-08-08 MED ORDER — MIDAZOLAM HCL 2 MG/2ML IJ SOLN
INTRAMUSCULAR | Status: AC
  Filled 2015-08-08: qty 4

## 2015-08-08 MED ORDER — FENTANYL CITRATE (PF) 100 MCG/2ML IJ SOLN
25.0000 ug | INTRAMUSCULAR | Status: DC | PRN
  Administered 2015-08-08 (×8): 25 ug via INTRAVENOUS

## 2015-08-08 MED ORDER — NALOXONE HCL 0.4 MG/ML IJ SOLN: 0.4000 mg | INTRAMUSCULAR | Status: DC | PRN

## 2015-08-08 MED ORDER — GLYCOPYRROLATE 0.2 MG/ML IJ SOLN
INTRAMUSCULAR | Status: AC
  Filled 2015-08-08: qty 3

## 2015-08-08 MED ORDER — MORPHINE SULFATE (PF) 10 MG/ML IV SOLN
INTRAVENOUS | Status: AC
  Filled 2015-08-08: qty 1

## 2015-08-08 MED ORDER — DIPHENHYDRAMINE HCL 12.5 MG/5ML PO ELIX
12.5000 mg | ORAL_SOLUTION | Freq: Four times a day (QID) | ORAL | Status: DC | PRN
  Filled 2015-08-08: qty 5

## 2015-08-08 MED ORDER — ONDANSETRON HCL 4 MG/2ML IJ SOLN
INTRAMUSCULAR | Status: AC
  Filled 2015-08-08: qty 2

## 2015-08-08 MED ORDER — BUPIVACAINE HCL (PF) 0.5 % IJ SOLN
INTRAMUSCULAR | Status: AC
  Filled 2015-08-08: qty 30

## 2015-08-08 MED ORDER — HYDROCHLOROTHIAZIDE 12.5 MG PO CAPS
12.5000 mg | ORAL_CAPSULE | Freq: Every day | ORAL | Status: DC
  Administered 2015-08-09 – 2015-08-13 (×5): 12.5 mg via ORAL
  Filled 2015-08-08 (×8): qty 1

## 2015-08-08 MED ORDER — MORPHINE SULFATE 1 MG/ML IV SOLN
INTRAVENOUS | Status: AC
  Filled 2015-08-08: qty 25

## 2015-08-08 MED ORDER — FENTANYL CITRATE (PF) 100 MCG/2ML IJ SOLN
INTRAMUSCULAR | Status: DC | PRN
  Administered 2015-08-08 (×7): 50 ug via INTRAVENOUS

## 2015-08-08 MED ORDER — FENTANYL CITRATE (PF) 100 MCG/2ML IJ SOLN
INTRAMUSCULAR | Status: AC
  Filled 2015-08-08: qty 4

## 2015-08-08 MED ORDER — ROCURONIUM BROMIDE 100 MG/10ML IV SOLN
INTRAVENOUS | Status: AC
  Filled 2015-08-08: qty 1

## 2015-08-08 MED ORDER — ROCURONIUM BROMIDE 100 MG/10ML IV SOLN
INTRAVENOUS | Status: DC | PRN
  Administered 2015-08-08: 10 mg via INTRAVENOUS
  Administered 2015-08-08: 30 mg via INTRAVENOUS
  Administered 2015-08-08: 10 mg via INTRAVENOUS

## 2015-08-08 MED ORDER — HYDROCHLOROTHIAZIDE 25 MG PO TABS
12.5000 mg | ORAL_TABLET | Freq: Every day | ORAL | Status: DC
  Filled 2015-08-08 (×2): qty 0.5

## 2015-08-08 MED ORDER — ARTIFICIAL TEARS OP OINT
TOPICAL_OINTMENT | OPHTHALMIC | Status: AC
  Filled 2015-08-08: qty 3.5

## 2015-08-08 MED ORDER — MORPHINE SULFATE 1 MG/ML IV SOLN
INTRAVENOUS | Status: DC
  Administered 2015-08-08 (×2): 1.5 mg via INTRAVENOUS
  Administered 2015-08-08: 9 mg via INTRAVENOUS
  Administered 2015-08-08 – 2015-08-09 (×2): 1.5 mg via INTRAVENOUS
  Administered 2015-08-09: 6 mg via INTRAVENOUS
  Administered 2015-08-09 (×2): 1.5 mg via INTRAVENOUS
  Administered 2015-08-09: 1 mg via INTRAVENOUS
  Administered 2015-08-09: 6 mg via INTRAVENOUS
  Administered 2015-08-10: 0 mg via INTRAVENOUS
  Administered 2015-08-10: 1.5 mg via INTRAVENOUS
  Filled 2015-08-08: qty 25

## 2015-08-08 MED ORDER — MIDAZOLAM HCL 5 MG/5ML IJ SOLN
INTRAMUSCULAR | Status: DC | PRN
  Administered 2015-08-08: 1 mg via INTRAVENOUS

## 2015-08-08 MED ORDER — LIDOCAINE HCL (CARDIAC) 20 MG/ML IV SOLN
INTRAVENOUS | Status: AC
  Filled 2015-08-08: qty 5

## 2015-08-08 MED ORDER — METHYLPREDNISOLONE ACETATE 40 MG/ML IJ SUSP
INTRAMUSCULAR | Status: AC
  Filled 2015-08-08: qty 1

## 2015-08-08 MED ORDER — DEXAMETHASONE SODIUM PHOSPHATE 10 MG/ML IJ SOLN
INTRAMUSCULAR | Status: DC | PRN
  Administered 2015-08-08: 10 mg via INTRAVENOUS

## 2015-08-08 MED ORDER — ARTIFICIAL TEARS OP OINT
TOPICAL_OINTMENT | OPHTHALMIC | Status: DC | PRN
  Administered 2015-08-08: 1 via OPHTHALMIC

## 2015-08-08 MED ORDER — PHENYLEPHRINE HCL 10 MG/ML IJ SOLN
INTRAMUSCULAR | Status: DC | PRN
  Administered 2015-08-08 (×3): 40 ug via INTRAVENOUS
  Administered 2015-08-08: 80 ug via INTRAVENOUS

## 2015-08-08 MED ORDER — DIPHENHYDRAMINE HCL 50 MG/ML IJ SOLN: 12.5000 mg | Freq: Four times a day (QID) | INTRAMUSCULAR | Status: DC | PRN

## 2015-08-08 MED ORDER — CLONAZEPAM 0.5 MG PO TABS: 0.5000 mg | ORAL_TABLET | Freq: Three times a day (TID) | ORAL | Status: DC | PRN

## 2015-08-08 MED ORDER — PROPOFOL 10 MG/ML IV BOLUS
INTRAVENOUS | Status: AC
  Filled 2015-08-08: qty 20

## 2015-08-08 MED ORDER — SODIUM CHLORIDE 0.9 % IJ SOLN: 9.0000 mL | INTRAMUSCULAR | Status: DC | PRN

## 2015-08-08 MED ORDER — GLYCOPYRROLATE 0.2 MG/ML IJ SOLN
INTRAMUSCULAR | Status: DC | PRN
  Administered 2015-08-08: 0.4 mg via INTRAVENOUS

## 2015-08-08 SURGICAL SUPPLY — 55 items
BLADE EXTENDED COATED 6.5IN (ELECTRODE) ×2 IMPLANT
BLADE HEX COATED 2.75 (ELECTRODE) ×4 IMPLANT
BRR ADH 6X5 SEPRAFILM 1 SHT (MISCELLANEOUS) ×1
CELLS DAT CNTRL 66122 CELL SVR (MISCELLANEOUS) ×1 IMPLANT
CHLORAPREP W/TINT 26ML (MISCELLANEOUS) ×2 IMPLANT
COVER MAYO STAND STRL (DRAPES) ×4 IMPLANT
COVER SURGICAL LIGHT HANDLE (MISCELLANEOUS) ×2 IMPLANT
DRAIN CHANNEL 19F RND (DRAIN) ×2 IMPLANT
DRAPE LAPAROSCOPIC ABDOMINAL (DRAPES) ×2 IMPLANT
DRAPE SHEET LG 3/4 BI-LAMINATE (DRAPES) IMPLANT
DRAPE WARM FLUID 44X44 (DRAPE) ×2 IMPLANT
DRSG OPSITE POSTOP 4X10 (GAUZE/BANDAGES/DRESSINGS) ×2 IMPLANT
DRSG OPSITE POSTOP 4X6 (GAUZE/BANDAGES/DRESSINGS) ×1 IMPLANT
DRSG TEGADERM 4X4.75 (GAUZE/BANDAGES/DRESSINGS) ×2 IMPLANT
ELECT REM PT RETURN 9FT ADLT (ELECTROSURGICAL) ×2
ELECTRODE REM PT RTRN 9FT ADLT (ELECTROSURGICAL) ×1 IMPLANT
GAUZE SPONGE 4X4 12PLY STRL (GAUZE/BANDAGES/DRESSINGS) ×2 IMPLANT
GLOVE BIOGEL PI IND STRL 7.0 (GLOVE) ×2 IMPLANT
GLOVE BIOGEL PI INDICATOR 7.0 (GLOVE) ×2
KIT BASIN OR (CUSTOM PROCEDURE TRAY) ×2 IMPLANT
LEGGING LITHOTOMY PAIR STRL (DRAPES) IMPLANT
LIGASURE IMPACT 36 18CM CVD LR (INSTRUMENTS) IMPLANT
NS IRRIG 1000ML POUR BTL (IV SOLUTION) ×4 IMPLANT
PACK GENERAL/GYN (CUSTOM PROCEDURE TRAY) ×2 IMPLANT
POUCH DRAINABLE 1PC 2 1/4 FLAT (OSTOMY) ×2 IMPLANT
RELOAD PROXIMATE 75MM BLUE (ENDOMECHANICALS) ×2 IMPLANT
RELOAD STAPLE 75 3.8 BLU REG (ENDOMECHANICALS) IMPLANT
RTRCTR WOUND ALEXIS 18CM MED (MISCELLANEOUS) ×2
SEPRAFILM MEMBRANE 5X6 (MISCELLANEOUS) ×1 IMPLANT
SPONGE LAP 18X18 X RAY DECT (DISPOSABLE) IMPLANT
STAPLER CUT CVD 40MM BLUE (STAPLE) ×1 IMPLANT
STAPLER CUT RELOAD BLUE (STAPLE) ×2 IMPLANT
STAPLER PROXIMATE 75MM BLUE (STAPLE) ×2 IMPLANT
STAPLER VISISTAT 35W (STAPLE) ×2 IMPLANT
SUCTION POOLE TIP (SUCTIONS) ×2 IMPLANT
SUT PDS AB 1 CTX 36 (SUTURE) IMPLANT
SUT PDS AB 1 TP1 54 (SUTURE) ×4 IMPLANT
SUT PDS AB 1 TP1 96 (SUTURE) IMPLANT
SUT PROLENE 0 CT 1 30 (SUTURE) ×1 IMPLANT
SUT PROLENE 2 0 BLUE (SUTURE) IMPLANT
SUT SILK 2 0 (SUTURE) ×4
SUT SILK 2 0 SH CR/8 (SUTURE) ×4 IMPLANT
SUT SILK 2 0SH CR/8 30 (SUTURE) IMPLANT
SUT SILK 2-0 18XBRD TIE 12 (SUTURE) ×2 IMPLANT
SUT SILK 2-0 30XBRD TIE 12 (SUTURE) IMPLANT
SUT SILK 3 0 (SUTURE) ×4
SUT SILK 3 0 SH CR/8 (SUTURE) ×4 IMPLANT
SUT SILK 3-0 18XBRD TIE 12 (SUTURE) ×2 IMPLANT
SUT VIC AB 2-0 CT1 36 (SUTURE) ×1 IMPLANT
SUT VIC AB 2-0 SH 18 (SUTURE) ×1 IMPLANT
SUT VIC AB 4-0 PS2 27 (SUTURE) ×2 IMPLANT
SYR BULB IRRIGATION 50ML (SYRINGE) ×2 IMPLANT
TOWEL OR 17X26 10 PK STRL BLUE (TOWEL DISPOSABLE) ×4 IMPLANT
TRAY FOLEY W/METER SILVER 14FR (SET/KITS/TRAYS/PACK) ×2 IMPLANT
TRAY FOLEY W/METER SILVER 16FR (SET/KITS/TRAYS/PACK) ×2 IMPLANT

## 2015-08-08 NOTE — Anesthesia Procedure Notes (Addendum)
Procedure Name: Intubation Date/Time: 08/08/2015 10:53 AM Performed by: Montel Clock Pre-anesthesia Checklist: Patient identified, Emergency Drugs available, Suction available, Patient being monitored and Timeout performed Patient Re-evaluated:Patient Re-evaluated prior to inductionOxygen Delivery Method: Circle system utilized Preoxygenation: Pre-oxygenation with 100% oxygen Intubation Type: IV induction Ventilation: Mask ventilation without difficulty and Oral airway inserted - appropriate to patient size Laryngoscope Size: Mac and 3 Grade View: Grade II Tube type: Oral Tube size: 7.0 mm Number of attempts: 1 Airway Equipment and Method: Stylet Placement Confirmation: ETT inserted through vocal cords under direct vision,  positive ETCO2 and breath sounds checked- equal and bilateral Secured at: 21 cm Tube secured with: Tape Dental Injury: Teeth and Oropharynx as per pre-operative assessment  Comments: Downward laryngeal pressure required for Grade 2 view. Glottis appeared anterior, difficult to visualize epiglottis. Anatomy initially difficult to visualize with esophagus first visualized left of glottis. Cap on lower right back tooth very loose preoperative. Cap was off after intubation and was removed from mouth. Patient was aware of risk and had stated preoperatively it was "ok" if cap came off.

## 2015-08-08 NOTE — Progress Notes (Signed)
Patient ID: [name], female   DOB: 09-03-1946, 69 y.o.   MRN: [id number] Day of Surgery  Subjective: Pt feels ok.  No new complaints.  Been distended for about the last 2 months on and off.  Objective: Vital signs in last 24 hours: Temp:  [97.8 F (36.6 C)-99.1 F (37.3 C)] 99.1 F (37.3 C) (09/28 0518) Pulse Rate:  [62-78] 78 (09/28 0518) Resp:  [11-18] 16 (09/28 0518) BP: (116-159)/(49-93) 140/84 mmHg (09/28 0518) SpO2:  [97 %-100 %] 97 % (09/28 0518) Weight:  [47.628 kg (105 lb)] 47.628 kg (105 lb) (09/28 0529) Last BM Date: 08/07/15  Intake/Output from previous day: 09/27 0701 - 09/28 0700 In: 1200 [I.V.:1200] Out: 2650 [Urine:2650] Intake/Output this shift:    PE: Abd: distended, tight especially in her upper abdomen, +BS, not really tender Heart: regular Lungs: CTAB  Lab Results:   Recent Labs  08/06/15 1648 08/08/15 0520  WBC 9.0 5.1  HGB 14.7 12.5  HCT 43.9 37.9  PLT 369 271   BMET  Recent Labs  08/06/15 1648 08/08/15 0520  NA 136 138  K 2.9* 4.1  CL 98* 109  CO2 29 25  GLUCOSE 100* 109*  BUN 7 <5*  CREATININE 0.71 0.47  CALCIUM 9.1 8.1*   PT/INR  Recent Labs  08/06/15 1648  LABPROT 13.3  INR 0.99   CMP     Component Value Date/Time   NA 138 08/08/2015 0520   K 4.1 08/08/2015 0520   CL 109 08/08/2015 0520   CO2 25 08/08/2015 0520   GLUCOSE 109* 08/08/2015 0520   BUN <5* 08/08/2015 0520   CREATININE 0.47 08/08/2015 0520   CALCIUM 8.1* 08/08/2015 0520   PROT 7.0 08/06/2015 1648   ALBUMIN 3.9 08/06/2015 1648   AST 24 08/06/2015 1648   ALT 17 08/06/2015 1648   ALKPHOS 69 08/06/2015 1648   BILITOT 1.0 08/06/2015 1648   GFRNONAA >60 08/08/2015 0520   GFRAA >60 08/08/2015 0520   Lipase  No results found for: LIPASE     Studies/Results: Dg Abd Acute W/chest  08/06/2015   CLINICAL DATA:  Admitted to hospital today with severe abdominal pain, nausea, and abdominal distension which has been occurring off and on for 2  months, not passing gas, only passing small amount of liquid stool, 20 lb weight loss over 2 months, anorexia  EXAM: DG ABDOMEN ACUTE W/ 1V CHEST  COMPARISON:  08/02/2015 CT abdomen and pelvis  FINDINGS: Normal heart size, mediastinal contours and pulmonary vascularity.  Emphysematous and bronchitic changes consistent with COPD.  Significant elevation of LEFT diaphragm.  Mild LEFT basilar atelectasis.  No definite infiltrate, pleural effusion or pneumothorax.  Diffuse osseous demineralization.  Significant gaseous distention of the colon with cecum up to 14.8 cm diameter.  No definite free intraperitoneal air identified.  Minimal small bowel gas dilatation.  No definite bowel wall thickening.  Bones severely demineralized with levoconvex thoracolumbar scoliosis and scattered degenerative changes.  IMPRESSION: Significant gaseous distention of the colon to the level of the sigmoid colon; prior CT exam demonstrated a rectosigmoid obstruction.  Cecum measures approximately 14.8 cm transverse diameter but no definite free intraperitoneal air is identified.  Findings called to [place] on 5W on 08/06/2015 at 1722 hour.   Electronically Signed   By: Lavonia Dana M.D.   On: 08/06/2015 17:24    Anti-infectives: Anti-infectives    Start     Dose/Rate Route Frequency Ordered Stop   08/08/15 0600  cefoTEtan (CEFOTAN) 2 g  in dextrose 5 % 50 mL IVPB     2 g 100 mL/hr over 30 Minutes Intravenous On call to O.R. 08/07/15 1501 08/09/15 0559       Assessment/Plan  1. Colonic obstruction -CT scan appears to have a colorectal staple line and stricture here, but patient is only aware of a partial right colectomy done by Dr. Zella Richer secondary to an incarcerated right sided inguinal hernia.  She has had an ectopic pregnancy and is not sure if they did any resection at that time.   -appreciate GI assistance, Flex sig does not show an obvious mass.  I have recommended resection and colostomy.  See yesterday's noted for  complete account of our discussion.  All questions answered.     LOS: 2 days    THOMAS, ALICIA C. 3/49/1791, 5:05 AM

## 2015-08-08 NOTE — Anesthesia Postprocedure Evaluation (Signed)
  Anesthesia Post-op Note  Patient: [name]  Procedure(s) Performed: Procedure(s): Open [place] Procedure, Sigmoid Resection, Colostomy (N/A)  Patient Location: PACU  Anesthesia Type:General  Level of Consciousness: awake and alert   Airway and Oxygen Therapy: Patient Spontanous Breathing  Post-op Pain: mild  Post-op Assessment: Post-op Vital signs reviewed, Patient's Cardiovascular Status Stable and Respiratory Function Stable              Post-op Vital Signs: stable  Last Vitals:  Filed Vitals:   08/08/15 1430  BP:   Pulse: 84  Temp:   Resp: 13    Complications: No apparent anesthesia complications

## 2015-08-08 NOTE — Transfer of Care (Signed)
Immediate Anesthesia Transfer of Care Note  Patient: [name]  Procedure(s) Performed: Procedure(s): Open [place] Procedure, Sigmoid Resection, Colostomy (N/A)  Patient Location: PACU  Anesthesia Type:General  Level of Consciousness:  sedated, patient cooperative and responds to stimulation  Airway & Oxygen Therapy:Patient Spontanous Breathing and Patient connected to face mask oxgen  Post-op Assessment:  Report given to PACU RN and Post -op Vital signs reviewed and stable  Post vital signs:  Reviewed and stable  Last Vitals:  Filed Vitals:   08/08/15 0518  BP: 140/84  Pulse: 78  Temp: 37.3 C  Resp: 16    Complications: No apparent anesthesia complications

## 2015-08-08 NOTE — Op Note (Signed)
08/06/2015 - 08/08/2015  1:16 PM  PATIENT:  [name]  69 y.o. female  Patient Care Team: Maurice Small, MD as PCP - General (Family Medicine)  PRE-OPERATIVE DIAGNOSIS:  Colonic Stricture  POST-OPERATIVE DIAGNOSIS:  Colonic Stricture  PROCEDURE:  Open [place] Procedure (Sigmoid Resection with Colostomy)  Surgeon(s): Leighton Ruff, MD  ASSISTANT: none   ANESTHESIA:   general  EBL:  Total I/O In: 2000 [I.V.:2000] Out: 850 [Urine:550; Blood:300]  DRAINS: none   SPECIMEN:  Source of Specimen:  sigmoid colon  DISPOSITION OF SPECIMEN:  PATHOLOGY  COUNTS:  YES  PLAN OF CARE: patient admitted  PATIENT DISPOSITION:  PACU - hemodynamically stable.  INDICATION: colonic obstruction   OR FINDINGS: Significant pelvic inflammatory tissue. Sigmoid colon mass.  DESCRIPTION: the patient was identified in the preoperative holding area and taken to the OR where they were laid supine on the operating room table.  General anesthesia was induced without difficulty. SCDs were also noted to be in place prior to the initiation of anesthesia.  The patient was then prepped and draped in the usual sterile fashion.   A surgical timeout was performed indicating the correct patient, procedure, positioning and need for preoperative antibiotics.   I began by making a lower midline incision through her previous scar using a 10 blade scalpel. This was carried down through subcutaneous tissues using electrocautery. The fascia was incised at midline. The peritoneum was entered bluntly. Adhesions were taken down from the abdominal wall which consisted of mostly omentum. I placed an Alexis wound protector into the abdomen and began to pack the small bowel out of the pelvis using lap sponges. I identified the sigmoid colon. I palpated the pelvis and the mass could be palpated in the rectosigmoid region. I freed the sigmoid colon from the lateral sidewall using accommodation of sharp and blunt dissection. There  were significant adhesions here due to her previous ectopic pregnancy surgery. I freed the left fallopian tube and ovary from the colon. After this was free, I was able to evaluate the retroperitoneum and identified the left ureter in its standard anatomical position. I divided the sigmoid colon using a GIA blue load stapler with 2 loads. This was significantly dilated due to her obstructive disease. I then identified the sigmoid artery and vein. After confirming the ureter was out of the way I transected these using cut and clamp technique and tied with a 2-0 silk suture ligature on both ends. I then opened up the posterior rectal plane bluntly. This allowed me to mobilize laterally within the I then divided the inflammatory adhesions from the uterus and bladder using sharp dissection. Once the uterus and bladder were free I was able to mobilize the sigmoid colon out of the pelvis. The mass was associated with this. I used a blue load contour stapler to transect the rectosigmoid junction. 2-0 Prolene sutures were used to identify the staple line for subsequent surgery. The right side was tacked to the right peritoneum. The specimen was sent to pathology for further examination. Hemostasis was noted to be adequate in the pelvis. I placed a layer of Seprafilm in the pelvis to help with adhesions. The packs were removed. Sponge count was correct after these were removed. The descending colon was mobilized off of the sidewall using electrocautery. I then made a incision in the skin through the previously marked ostomy site in the left upper quadrant. This was carried down through subcutaneous tissues. The fascia was incised in a cruciate manner. The  peritoneum was divided. The end of the colon was brought out through the ostomy site and secured. I then brought the omentum down over the small bowel and closed the peritoneal layer using a 2-0 running Vicryl suture. The fascia was then closed using 2 #1 PDS sutures. The  skin was closed with staples. A sterile dressing was applied. The ostomy was then matured in standard Brooke fashion using 2-0 Vicryl sutures. An ostomy appliance was then applied. The patient was awakened from anesthesia and sent to the postanesthesia care unit in stable condition. All counts were correct per operating room staff.

## 2015-08-08 NOTE — Anesthesia Preprocedure Evaluation (Addendum)
Anesthesia Evaluation  Patient identified by MRN, date of birth, ID band Patient awake    Reviewed: Allergy & Precautions, NPO status , Patient's Chart, lab work & pertinent test results  History of Anesthesia Complications Negative for: history of anesthetic complications  Airway Mallampati: II  TM Distance: >3 FB Neck ROM: Full    Dental  (+)    Pulmonary neg pulmonary ROS,    breath sounds clear to auscultation       Cardiovascular hypertension,  Rhythm:Regular Rate:Normal     Neuro/Psych Seizures -,     GI/Hepatic Neg liver ROS,   Endo/Other    Renal/GU negative Renal ROS     Musculoskeletal   Abdominal   Peds  Hematology negative hematology ROS (+)   Anesthesia Other Findings   Reproductive/Obstetrics                           Anesthesia Physical Anesthesia Plan  ASA: II  Anesthesia Plan: General   Post-op Pain Management:    Induction: Intravenous  Airway Management Planned: Oral ETT  Additional Equipment:   Intra-op Plan:   Post-operative Plan: Extubation in OR  Informed Consent: I have reviewed the patients History and Physical, chart, labs and discussed the procedure including the risks, benefits and alternatives for the proposed anesthesia with the patient or authorized representative who has indicated his/her understanding and acceptance.   Dental advisory given  Plan Discussed with:   Anesthesia Plan Comments:         Anesthesia Quick Evaluation

## 2015-08-09 ENCOUNTER — Encounter (HOSPITAL_COMMUNITY): Payer: Self-pay | Admitting: Gastroenterology

## 2015-08-09 MED ORDER — METHOCARBAMOL 1000 MG/10ML IJ SOLN
500.0000 mg | Freq: Three times a day (TID) | INTRAVENOUS | Status: DC | PRN
  Administered 2015-08-09: 500 mg via INTRAVENOUS
  Filled 2015-08-09 (×2): qty 5

## 2015-08-09 MED ORDER — ACETAMINOPHEN 10 MG/ML IV SOLN
1000.0000 mg | Freq: Four times a day (QID) | INTRAVENOUS | Status: AC
  Administered 2015-08-09 – 2015-08-10 (×3): 1000 mg via INTRAVENOUS
  Filled 2015-08-09 (×4): qty 100

## 2015-08-09 MED ORDER — ENOXAPARIN SODIUM 40 MG/0.4ML ~~LOC~~ SOLN
40.0000 mg | SUBCUTANEOUS | Status: DC
  Administered 2015-08-09 – 2015-08-13 (×5): 40 mg via SUBCUTANEOUS
  Filled 2015-08-09 (×5): qty 0.4

## 2015-08-09 MED ORDER — KCL IN DEXTROSE-NACL 30-5-0.45 MEQ/L-%-% IV SOLN
INTRAVENOUS | Status: DC
  Administered 2015-08-09 – 2015-08-10 (×2): via INTRAVENOUS
  Administered 2015-08-11: 1000 mL via INTRAVENOUS
  Administered 2015-08-12: 11:00:00 via INTRAVENOUS
  Filled 2015-08-09 (×6): qty 1000

## 2015-08-09 NOTE — Progress Notes (Addendum)
ANTICOAGULATION CONSULT NOTE - Initial Consult  Pharmacy Consult for Lovenox Indication: VTE prophylaxis  Allergies  Allergen Reactions  . Codeine     Auditory hallucinations, per pt.   . Promethazine Hcl     Does not agree with patient. Tolerates ondansetron.    . Simvastatin     Muscle pain.     Patient Measurements: Height: 5\' 5"  (165.1 cm) Weight: 105 lb (47.628 kg) IBW/kg (Calculated) : 57  Labs:  Recent Labs  08/06/15 1648 08/08/15 0520  HGB 14.7 12.5  HCT 43.9 37.9  PLT 369 271  LABPROT 13.3  --   INR 0.99  --   CREATININE 0.71 0.47    Estimated Creatinine Clearance: 49.2 mL/min (by C-G formula based on Cr of 0.47).    Medications:  Scheduled:  . acetaminophen  1,000 mg Intravenous 4 times per day  . enoxaparin (LOVENOX) injection  40 mg Subcutaneous Q24H  . hydrochlorothiazide  12.5 mg Oral Daily  . metoprolol succinate  50 mg Oral QHS  . morphine   Intravenous 6 times per day  . pantoprazole (PROTONIX) IV  40 mg Intravenous QHS    Assessment: 69 y.o. female admitted 08/06/2015 for colonic obstruction, s/p sigmoid resection w/ colostomy on 9/28.  Pharmacy to dose Lovenox for VTE prophylaxis; to start at least 24 hrs after surgery per [facility]   CBC: ABLA as expected, acceptable  SCr: stable wnl  Previous anticoagulation: none   Goal of Therapy: Prevention of VTE  Plan:  Lovenox 40 mg SQ q24 hr  Pharmacy to sign off; recommend rechecking SCr q 72 hr   Reuel Boom, PharmD, BCPS Pager: [phone] 08/09/2015, 12:02 PM

## 2015-08-09 NOTE — Progress Notes (Signed)
1 Day Post-Op  Subjective: She has had allot of drainage from her ostomy, very large ostomy, and bag was changed last PM.  There is a leak again this Am so they are changing it again now.  She has back and abdominal pain, but her abdomen is not distended this Am , new for her.  Objective: Vital signs in last 24 hours: Temp:  [97.7 F (36.5 C)-98.6 F (37 C)] 98.4 F (36.9 C) (09/29 0812) Pulse Rate:  [55-90] 76 (09/29 0812) Resp:  [10-18] 14 (09/29 0812) BP: (82-132)/(45-79) 126/65 mmHg (09/29 0812) SpO2:  [95 %-100 %] 100 % (09/29 0812) Last BM Date: 08/07/15 180 from the colostomy NPO Afebrile, VSS K+ up to 4.1 Intake/Output from previous day: 09/28 0701 - 09/29 0700 In: 3060 [I.V.:3060] Out: 2055 [Urine:1575; Stool:180; Blood:300] Intake/Output this shift:    General appearance: alert, cooperative, no distress and sore from her back and incision Resp: clear to auscultation bilaterally Abd:  Large ostomy it is pink and looks OK.  BS still hypoactive.  Large volume of stool coming from ostomy, it is very liquid.   Lab Results:   Recent Labs  08/06/15 1648 08/08/15 0520  WBC 9.0 5.1  HGB 14.7 12.5  HCT 43.9 37.9  PLT 369 271    BMET  Recent Labs  08/06/15 1648 08/08/15 0520  NA 136 138  K 2.9* 4.1  CL 98* 109  CO2 29 25  GLUCOSE 100* 109*  BUN 7 <5*  CREATININE 0.71 0.47  CALCIUM 9.1 8.1*   PT/INR  Recent Labs  08/06/15 1648  LABPROT 13.3  INR 0.99     Recent Labs Lab 08/06/15 1648  AST 24  ALT 17  ALKPHOS 69  BILITOT 1.0  PROT 7.0  ALBUMIN 3.9     Lipase  No results found for: LIPASE   Studies/Results: No results found.  Medications: . hydrochlorothiazide  12.5 mg Oral Daily  . metoprolol succinate  50 mg Oral QHS  . morphine   Intravenous 6 times per day  . pantoprazole (PROTONIX) IV  40 mg Intravenous QHS   . lactated ringers 1,000 mL (08/08/15 2345)   Prior to Admission medications   Medication Sig Start Date End Date  Taking? Authorizing Provider  acetaminophen (TYLENOL) 500 MG tablet Take 500 mg by mouth every 6 (six) hours as needed. For pain.   Yes Historical Provider, MD  aspirin 81 MG tablet Take 81 mg by mouth daily.    Yes Historical Provider, MD  atorvastatin (LIPITOR) 10 MG tablet Take 10 mg by mouth every other day.   Yes Historical Provider, MD  cholecalciferol (VITAMIN D) 1000 UNITS tablet Take 1,000 Units by mouth daily.   Yes Historical Provider, MD  clonazePAM (KLONOPIN) 1 MG tablet Take 0.5 mg by mouth 3 (three) times daily as needed for anxiety. For anxiety.   Yes Historical Provider, MD  fluticasone (FLONASE) 50 MCG/ACT nasal spray Place 2 sprays into the nose daily.   Yes Historical Provider, MD  hydrochlorothiazide 25 MG tablet Take 12.5 mg by mouth daily.    Yes Historical Provider, MD  Melatonin 5 MG TABS Take 5 mg by mouth at bedtime.    Yes Historical Provider, MD  metoprolol (TOPROL-XL) 50 MG 24 hr tablet Take 50 mg by mouth daily.     Yes Historical Provider, MD  Multiple Vitamin (MULTIVITAMIN WITH MINERALS) TABS Take 1 tablet by mouth daily.   Yes Historical Provider, MD  ondansetron (ZOFRAN) 4 MG tablet  Take 4 mg by mouth every 8 (eight) hours as needed. For nausea.   Yes Historical Provider, MD  tetrahydrozoline 0.05 % ophthalmic solution Place 1 drop into both eyes 2 (two) times daily as needed (allergy irritation or dry eyes.).   Yes Historical Provider, MD     Assessment/Plan Colonic stricture S/p Open [place] Procedure (Sigmoid Resection with Colostomy), 7/54/36, Dr. Leighton Ruff  POD 1 Hx of hypertension Hx of GERD Hx of diverticulosis Hx of stomach resection and LIH repair Antibiotics:  Pre op only DVT:  Lovenox per pharmacy this PM/SCD  Plan:  D/c foley, add IV tylenol, and robaxin for pain issues.  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  Wound care to see.  I told her I would give her sips of clears from the floor.  She needs an IS to work on also    LOS: 3 days     JENNINGS,WILLARD 08/09/2015

## 2015-08-09 NOTE — Progress Notes (Signed)
Verbal order given my MD Marcello Moores to enter PT evaluation Neta Mends RN 4:20 PM  08-09-2015

## 2015-08-09 NOTE — Consult Note (Signed)
WOC ostomy consult note Stoma type/location: LLQ Colostomy  Stomal assessment/size: Red, moist, edematous (large), budded. Os at center Peristomal assessment: not seen today Treatment options for stomal/peristomal skin: None today.  I will plan to use a skin barrier ring in the morning Output: scant amount serosanguinous  Ostomy pouching: 1pc.today Education provided: Patient recalls that I told her that stoma would be edematous and large.  She is shocked at how large it is, but relieved to know that the stoma will shrink in the first 2-4 weeks considerably. Today I have provided her with an educational booklet and ordered supplies and we will plan for pouch change in the morning. She is anxious about whether or not she will go to a Rehab facility vs home with home care as she lives alone and feels as if she needs mor support initially.  She has a son who lives out of town in [place]. Enrolled patient in [facility] program: No WOC nursing team will follow, and  remain available to this patient, the nursing, surgical and medical teams.  Thanks, Maudie Flakes, MSN, RN, [place], [place], [place] [phone])

## 2015-08-10 LAB — CBC
HCT: 33 % — ABNORMAL LOW (ref 36.0–46.0)
Hemoglobin: 10.6 g/dL — ABNORMAL LOW (ref 12.0–15.0)
MCH: 28.1 pg (ref 26.0–34.0)
MCHC: 32.1 g/dL (ref 30.0–36.0)
MCV: 87.5 fL (ref 78.0–100.0)
PLATELETS: 237 10*3/uL (ref 150–400)
RBC: 3.77 MIL/uL — ABNORMAL LOW (ref 3.87–5.11)
RDW: 13.7 % (ref 11.5–15.5)
WBC: 12.3 10*3/uL — AB (ref 4.0–10.5)

## 2015-08-10 LAB — BASIC METABOLIC PANEL
ANION GAP: 5 (ref 5–15)
BUN: <5 mg/dL — ABNORMAL LOW (ref 6–20)
CALCIUM: 8 mg/dL — AB (ref 8.9–10.3)
CO2: 29 mmol/L (ref 22–32)
Chloride: 102 mmol/L (ref 101–111)
Creatinine, Ser: 0.44 mg/dL (ref 0.44–1.00)
GLUCOSE: 94 mg/dL (ref 65–99)
Potassium: 3.5 mmol/L (ref 3.5–5.1)
Sodium: 136 mmol/L (ref 135–145)

## 2015-08-10 MED ORDER — OXYCODONE-ACETAMINOPHEN 5-325 MG PO TABS
1.0000 | ORAL_TABLET | ORAL | Status: DC | PRN
  Administered 2015-08-10 – 2015-08-12 (×3): 1 via ORAL
  Filled 2015-08-10 (×3): qty 1

## 2015-08-10 NOTE — Evaluation (Signed)
Physical Therapy Evaluation Patient Details Name: [name] MRN: [id number] DOB: 1946-10-08 Today's Date: 08/10/2015   History of Present Illness  69 yo female s/p sigmoid resection with colostomy 08/08/15. Pt is from home alone  Clinical Impression  On eval, pt required Min-guard to Min assist for mobility-walked ~300 feet (~150 with support of IV pole, ~150 without support). Pt remains unsteady at this time. Discussed d/c plan-pt lives alone and does not have assistance available in home. Feel pt would need to be mobilizing at a supervision level to safely return home alone. Pt is open to ST rehab if this is an option. If not an option, will require HHPT, home health aide. Will also need to be able to ascend/descend 1 flight of stairs.     Follow Up Recommendations SNF (pt lives alone and cannot mobilize without supervision/assist at this time. Would need to be at least supervision level to safely d/c home)    Equipment Recommendations   (to be determined-will continue to assess)    Recommendations for Other Services       Precautions / Restrictions Precautions Precautions: Fall Precaution Comments: L side colostomy Restrictions Weight Bearing Restrictions: No      Mobility  Bed Mobility Overal bed mobility: Needs Assistance Bed Mobility: Supine to Sit     Supine to sit: HOB elevated;Supervision     General bed mobility comments: HOB ~60 degrees. Increased time.  Transfers Overall transfer level: Needs assistance   Transfers: Sit to/from Stand Sit to Stand: Min guard         General transfer comment: close guard for safety  Ambulation/Gait Ambulation/Gait assistance: Min guard;Min assist Ambulation Distance (Feet): 300 Feet Assistive device:  (IV pole) Gait Pattern/deviations: Step-through pattern;Decreased stride length;Staggering left;Staggering right;Drifts right/left     General Gait Details: slow gait speed. Min assist without support of IV pole, Min  guard assist with support of IV pole. Pt tolerated distance well.   Stairs            Wheelchair Mobility    Modified Rankin (Stroke Patients Only)       Balance Overall balance assessment: Needs assistance         Standing balance support: During functional activity Standing balance-Leahy Scale: Fair                               Pertinent Vitals/Pain Pain Assessment: No/denies pain    Home Living Family/patient expects to be discharged to:: Private residence Living Arrangements: Alone   Type of Home: Apartment Home Access: Stairs to enter   Technical brewer of Steps: 1 flight Home Layout: One level Home Equipment: None      Prior Function Level of Independence: Independent               Hand Dominance        Extremity/Trunk Assessment   Upper Extremity Assessment: Overall WFL for tasks assessed           Lower Extremity Assessment: Generalized weakness      Cervical / Trunk Assessment: Normal  Communication   Communication: No difficulties  Cognition Arousal/Alertness: Awake/alert Behavior During Therapy: WFL for tasks assessed/performed Overall Cognitive Status: Within Functional Limits for tasks assessed                      General Comments      Exercises        Assessment/Plan  PT Assessment Patient needs continued PT services  PT Diagnosis Difficulty walking;Generalized weakness;Acute pain   PT Problem List Decreased strength;Decreased activity tolerance;Decreased balance;Decreased mobility;Pain  PT Treatment Interventions DME instruction;Gait training;Functional mobility training;Therapeutic activities;Patient/family education;Therapeutic exercise;Balance training   PT Goals (Current goals can be found in the Care Plan section) Acute Rehab PT Goals Patient Stated Goal: to regain independence PT Goal Formulation: With patient Time For Goal Achievement: 08/24/15 Potential to Achieve Goals:  Good    Frequency Min 3X/week   Barriers to discharge        Co-evaluation               End of Session   Activity Tolerance: Patient tolerated treatment well Patient left: in bed;with call bell/phone within reach           Time: 0917-0936 PT Time Calculation (min) (ACUTE ONLY): 19 min   Charges:   PT Evaluation $Initial PT Evaluation Tier I: 1 Procedure     PT G Codes:        Weston Anna, MPT Pager: [phone]

## 2015-08-10 NOTE — Clinical Social Work Note (Signed)
Clinical Social Work Assessment  Patient Details  Name: [name] MRN: [id number] Date of Birth: Sep 24, 1946  Date of referral:  08/10/15               Reason for consult:  Discharge Planning                Permission sought to share information with:  Family Supports Permission granted to share information::  No  Name::        Agency::     Relationship::     Contact Information:     Housing/Transportation Living arrangements for the past 2 months:  [place] of Information:  Patient Patient Interpreter Needed:  None Criminal Activity/Legal Involvement Pertinent to Current Situation/Hospitalization:  No - Comment as needed Significant Relationships:  Adult Children Lives with:  Self Do you feel safe going back to the place where you live?  No Need for family participation in patient care:  No (Coment)  Care giving concerns:  Pt from home alone. PT recommending SNF. RNCM spoke to pt and pt agreeable to SNF placement.   Social Worker assessment / plan:  CSW received notification from [facility] that pt agreeable to SNF placement. CSW initiated SNF search to [facility].  CSW followed up with pt at bedside. CSW provided pt with SNF bed offers. CSW clarified pt questions and concerns surrounding SNF. Pt states that pt son plans to come to town this weekend and she will likely speak with him about decision. CSW expressed understanding and provided pt with weekday CSW and weekend CSW contact information. Pt plans to review options and make a decision about SNF.   CSW to continue to follow to provide support and assist with pt disposition needs.   Employment status:  Therapist, music:  Managed Care PT Recommendations:  [place] / Referral to community resources:  [place]  Patient/Family's Response to care:  Pt alert and oriented x 4. Pt pleasant and actively involved in discussion. Pt prefers private  room at SNF, but plans to review options to make decision.   Patient/Family's Understanding of and Emotional Response to Diagnosis, Current Treatment, and Prognosis:  Pt reports that she anticipates that she will remain in the hospital until Monday. Pt eager to see MD this afternoon.  Emotional Assessment Appearance:  Appears stated age Attitude/Demeanor/Rapport:  Other (pt appropriate) Affect (typically observed):  Accepting, Pleasant Orientation:  Oriented to Self, Oriented to Place, Oriented to  Time, Oriented to Situation Alcohol / Substance use:  Not Applicable Psych involvement (Current and /or in the community):  No (Comment)  Discharge Needs  Concerns to be addressed:  Discharge Planning Concerns Readmission within the last 30 days:  No Current discharge risk:  None Barriers to Discharge:  Continued Medical Work up   [place], [place], LCSW 08/10/2015, 1:50 PM  [phone]

## 2015-08-10 NOTE — Discharge Instructions (Signed)
[facility]      [facility] °[phone] ° °OPEN ABDOMINAL SURGERY: POST OP INSTRUCTIONS ° °Always review your discharge instruction sheet given to you by the facility where your surgery was performed. ° °IF YOU HAVE DISABILITY OR FAMILY LEAVE FORMS, YOU MUST BRING THEM TO THE OFFICE FOR PROCESSING.  PLEASE DO NOT GIVE THEM TO YOUR DOCTOR. ° °1. A prescription for pain medication may be given to you upon discharge.  Take your pain medication as prescribed, if needed.  If narcotic pain medicine is not needed, then you may take acetaminophen (Tylenol) or ibuprofen (Advil) as needed. °2. Take your usually prescribed medications unless otherwise directed. °3. If you need a refill on your pain medication, please contact your pharmacy. They will contact our office to request authorization.  Prescriptions will not be filled after 5pm or on week-ends. °4. You should follow a light diet the first few days after arrival home, such as soup and crackers, pudding, etc.unless your doctor has advised otherwise. A high-fiber, low fat diet can be resumed as tolerated.   Be sure to include lots of fluids daily. Most patients will experience some swelling and bruising on the chest and neck area.  Ice packs will help.  Swelling and bruising can take several days to resolve °5. Most patients will experience some swelling and bruising in the area of the incision. Ice pack will help. Swelling and bruising can take several days to resolve..  °6. It is common to experience some constipation if taking pain medication after surgery.  Increasing fluid intake and taking a stool softener will usually help or prevent this problem from occurring.  A mild laxative (Milk of Magnesia or Miralax) should be taken according to package directions if there are no bowel movements after 48 hours. °7.  You may have steri-strips (small skin tapes) in place directly over the incision.  These strips should be left on the skin for 7-10 days.  If your  surgeon used skin glue on the incision, you may shower in 24 hours.  The glue will flake off over the next 2-3 weeks.  Any sutures or staples will be removed at the office during your follow-up visit. You may find that a light gauze bandage over your incision may keep your staples from being rubbed or pulled. You may shower and replace the bandage daily. °8. ACTIVITIES:  You may resume regular (light) daily activities beginning the next day--such as daily self-care, walking, climbing stairs--gradually increasing activities as tolerated.  You may have sexual intercourse when it is comfortable.  Refrain from any heavy lifting or straining until approved by your doctor. °a. You may drive when you no longer are taking prescription pain medication, you can comfortably wear a seatbelt, and you can safely maneuver your car and apply brakes °b. Return to Work: ___________________________________ °9. You should see your doctor in the office for a follow-up appointment approximately two weeks after your surgery.  Make sure that you call for this appointment within a day or two after you arrive home to insure a convenient appointment time. °OTHER INSTRUCTIONS:  °_____________________________________________________________ °_____________________________________________________________ ° °WHEN TO CALL YOUR DOCTOR: °1. Fever over 101.0 °2. Inability to urinate °3. Nausea and/or vomiting °4. Extreme swelling or bruising °5. Continued bleeding from incision. °6. Increased pain, redness, or drainage from the incision. °7. Difficulty swallowing or breathing °8. Muscle cramping or spasms. °9. Numbness or tingling in hands or feet or around lips. ° °The clinic staff is available to   answer your questions during regular business hours.  Please don’t hesitate to call and ask to speak to one of the nurses if you have concerns. ° °For further questions, please visit [email or web address] ° °Colostomy Home Guide °A colostomy is an  opening for stool to leave your body when a medical condition prevents it from leaving through the usual opening (rectum). During a surgery, a piece of large intestine (colon) is brought through a hole in the abdominal wall. The new opening is called a stoma or ostomy. A bag or pouch fits over the stoma to catch stool and gas. Your stool may be liquid, somewhat pasty, or formed. °CARING FOR YOUR STOMA  °Normally, the stoma looks a lot like the inside of your cheek: pink, red, and moist. At first it may be swollen, but this swelling will decrease within 6 weeks. °Keep the skin around your stoma clean and dry. You can gently wash your stoma and the skin around your stoma in the shower with a clean, soft washcloth. If you develop any skin irritation, your caregiver may give you a stoma powder or ointment to help heal the area. Do not use any products other than those specifically given to you by your caregiver.  °Your stoma should not be uncomfortable. If you notice any stinging or burning, your pouch may be leaking, and the skin around your stoma may be coming into contact with stool. This can cause skin irritation. If you notice stinging, replace your pouch with a new one and discard the old one. °OSTOMY POUCHES  °The pouch that fits over the ostomy can be made up of either 1 or 2 pieces. A one-piece pouch has a skin barrier piece and the pouch itself in one unit. A two-piece pouch has a skin barrier with a separate pouch that snaps on and off of the skin barrier. Either way, you should empty the pouch when it is only  to ½ full. Do not let more stool or gas build up. This could cause the pouch to leak. °Some ostomy bags have a built-in gas release valve. Ostomy deodorizer (5 drops) can be put into the pouch to prevent odor. Some people use ostomy lubricant drops inside the pouch to help the stool slide out of the bag more easily and completely.  °EMPTYING YOUR OSTOMY POUCH  °You may get lessons on how to empty your  pouch from a wound-ostomy nurse before you leave the hospital. Here are the basic steps: °· Wash your hands with soap and water. °· Sit far back on the toilet. °· Put several pieces of toilet paper into the toilet water. This will prevent splashing as you empty the stool into the toilet bowl. °· Unclip or unvelcro the tail end of the pouch. °· Unroll the tail and empty stool into the toilet. °· Clean the tail with toilet paper. °· Reroll the tail, and clip or velcro it closed. °· Wash your hands again. °CHANGING YOUR OSTOMY POUCH  °Change your ostomy pouch about every 3 to 4 days for the first 6 weeks, then every 5 to7 days. Always change the bag sooner if there is any leakage or you begin to notice any discomfort or irritation of the skin around the stoma. When possible, plan to change your ostomy pouch before eating or drinking as this will lessen the chance of stool coming out during the pouch change. A wound-ostomy nurse may teach you how to change your pouch before you leave the hospital. Here are   the basic steps:  [place] out your supplies.  Wash your hands with soap and water.  Carefully remove the old pouch.  Wash the stoma and allow it to dry. Men may be advised to shave any hair around the stoma very carefully. This will make the adhesive stick better.  Use the stoma measuring guide that comes with your pouch set to decide what size hole you will need to cut in the skin barrier piece. Choose the smallest possible size that will hold the stoma but will not touch it.  Use the guide to trace the circle on the back of the skin barrier piece. Cut out the hole.  Hold the skin barrier piece over the stoma to make sure the hole is the correct size.  Remove the adhesive paper backing from the skin barrier piece.  Squeeze stoma paste around the opening of the skin barrier piece.  Clean and dry the skin around the stoma again.  Carefully fit the skin barrier piece over your stoma.  If you are  using a two-piece pouch, snap the pouch onto the skin barrier piece.  Close the tail of the pouch.  Put your hand over the top of the skin barrier piece to help warm it for about 5 minutes, so that it conforms to your body better.  Wash your hands again. DIET TIPS   Continue to follow your usual diet.  Drink about eight 8 oz glasses of water each day.  You can prevent gas by eating slowly and chewing your food thoroughly.  If you feel concerned that you have too much gas, you can cut back on gas-producing foods, such as:  Spicy foods.  Onions and garlic.  Cruciferous vegetables (cabbage, broccoli, cauliflower, Brussels sprouts).  Beans and legumes.  Some cheeses.  Eggs.  Fish.  Bubbly (carbonated) drinks.  Chewing gum. GENERAL TIPS   You can shower with or without the bag in place.  Always keep the bag on if you are bathing or swimming.  If your bag gets wet, you can dry it with a blow-dryer set to cool.  Avoid wearing tight clothing directly over your stoma so that it does not become irritated or bleed. Tight clothing can also prevent stool from draining into the pouch.  It is helpful to always have an extra skin barrier and pouch with you when traveling. Do not leave them anywhere too warm, as parts of them can melt.  Do not let your seat belt rest on your stoma. Try to keep the seat belt either above or below your stoma, or use a tiny pillow to cushion it.  You can still participate in sports, but you should avoid activities in which there is a risk of getting hit in the abdomen.  You can still have sex. It is a good idea to empty your pouch prior to sex. Some people and their partners feel very comfortable seeing the pouch during sex. Others choose to wear lingerie or a T-shirt that covers the device. SEEK IMMEDIATE MEDICAL CARE IF:  You notice a change in the size or color of the stoma, especially if it becomes very red, purple, black, or pale white.  You  have bloody stools or bleeding from the stoma.  You have abdominal pain, nausea, vomiting, or bloating.  There is anything unusual protruding from the stoma.  You have irritation or red skin around the stoma.  No stool is passing from the stoma.  You have diarrhea (requiring more frequent  than normal pouch emptying). °Document Released: 10/30/2003 Document Revised: 01/19/2012 Document Reviewed: 03/26/2011 °ExitCare® Patient Information ©2015 [employer]. This information is not intended to replace advice given to you by your health care provider. Make sure you discuss any questions you have with your health care provider. ° °

## 2015-08-10 NOTE — Progress Notes (Signed)
Patient ID: [name], female   DOB: 11/15/1945, 69 y.o.   MRN: [id number] 2 Days Post-Op  Subjective: Pt feels ok today, but is a little unsteady on her feet.  Just got done with PT.  Tolerating clear liquids.  No nausea  Objective: Vital signs in last 24 hours: Temp:  [97.5 F (36.4 C)-98.5 F (36.9 C)] 97.5 F (36.4 C) (09/30 0600) Pulse Rate:  [71-87] 75 (09/30 0600) Resp:  [11-17] 15 (09/30 0820) BP: (109-136)/(58-75) 132/75 mmHg (09/30 0600) SpO2:  [99 %-100 %] 100 % (09/30 0820) FiO2 (%):  [33 %] 33 % (09/30 0451) Last BM Date: 08/07/15  Intake/Output from previous day: 09/29 0701 - 09/30 0700 In: 1456.3 [I.V.:1456.3] Out: 3230 [Urine:2975; Stool:255] Intake/Output this shift:    PE: Abd: soft, +BS, air in her bag, stoma is edematous and large as expected, midline incision is c/d/i with staples  Lab Results:   Recent Labs  08/08/15 0520 08/10/15 0612  WBC 5.1 12.3*  HGB 12.5 10.6*  HCT 37.9 33.0*  PLT 271 237   BMET  Recent Labs  08/08/15 0520 08/10/15 0612  NA 138 136  K 4.1 3.5  CL 109 102  CO2 25 29  GLUCOSE 109* 94  BUN <5* <5*  CREATININE 0.47 0.44  CALCIUM 8.1* 8.0*   PT/INR No results for input(s): LABPROT, INR in the last 72 hours. CMP     Component Value Date/Time   NA 136 08/10/2015 0612   K 3.5 08/10/2015 0612   CL 102 08/10/2015 0612   CO2 29 08/10/2015 0612   GLUCOSE 94 08/10/2015 0612   BUN <5* 08/10/2015 0612   CREATININE 0.44 08/10/2015 0612   CALCIUM 8.0* 08/10/2015 0612   PROT 7.0 08/06/2015 1648   ALBUMIN 3.9 08/06/2015 1648   AST 24 08/06/2015 1648   ALT 17 08/06/2015 1648   ALKPHOS 69 08/06/2015 1648   BILITOT 1.0 08/06/2015 1648   GFRNONAA >60 08/10/2015 0612   GFRAA >60 08/10/2015 0612   Lipase  No results found for: LIPASE     Studies/Results: No results found.  Anti-infectives: Anti-infectives    Start     Dose/Rate Route Frequency Ordered Stop   08/08/15 0600  cefoTEtan (CEFOTAN) 2 g in  dextrose 5 % 50 mL IVPB     2 g 100 mL/hr over 30 Minutes Intravenous On call to O.R. 08/07/15 1501 08/08/15 1103       Assessment/Plan  POD 2, s/p laparotomy with Hartman's procedure, Dr. Marcello Moores -cont routine ostomy care.  Her stoma is very edematous and may make changing her pouch and keeping it from leaking difficult -PT evaluated patient and feels a safe DC plan is not home by herself as she was unsteady on her feet. -CM/SW evaluating patient to help with disposition -WOC to see today to help with possible bag change and teaching -advance to full liquids -DC PCA and start oral pain meds.   LOS: 4 days    [name] 08/10/2015, 10:16 AM Pager: [phone]

## 2015-08-10 NOTE — Consult Note (Signed)
WOC ostomy follow up Stoma type/location: LLQ colostomy Stomal assessment/size: 3 inches round, moist, budded, edeamtous Peristomal assessment: intact. Stoma is in an area with many creases. Treatment options for stomal/peristomal skin: Skin barrier ring to encircle stoma Output: liquid efflu9ent Ostomy pouching: 2pc. 4-inch pouching system with skin barrier ring. Education provided: patient is taught that her stoma is large and that the edema will subside to reveal a smaller stoma.  Shown that while stoma has no nerve endings, the skin around it does and that itching and burning means that stool is on her skin and that the pouching system much be changed. Today I demonstrate sizing the stoma, but to adequately do this, I must lower the head of her bed so it is difficult for her to see.  She observes me tracing the pattern of her stoma onto the back of the 4-inch skin barrier and cutting it out.  She observes me attaching the skin barrier to her skin and affixing the pouch on to it.  She observes Agricultural consultant and then gives a return demonstration x2 of this skill.  She is taught that she may empty into the toilet while seated or standing once she is discharged. I have provided another 4-inch pouching system to the bedside and several 2 and 3/4 inch pouching systems.  Should the wrinkles interfere with 2-piece systems adhering, I will need to reevaluate for a 1-piece pouching system. Enrolled patient in [place] Start Discharge program: No WOC nursing team will follow, and will remain available to this patient, the nursing, surgical and medical teams. I will see over the weekend. Thanks, Maudie Flakes, MSN, RN, [place], [place], [place] [phone])

## 2015-08-10 NOTE — Clinical Social Work Placement (Signed)
   CLINICAL SOCIAL WORK PLACEMENT  NOTE  Date:  08/10/2015  Patient Details  Name: [name] MRN: [id number] Date of Birth: 03-06-1946  Clinical Social Work is seeking post-discharge placement for this patient at the [place] level of care (*CSW will initial, date and re-position this form in  chart as items are completed):  Yes   Patient/family provided with [place] Work Department's list of facilities offering this level of care within the geographic area requested by the patient (or if unable, by the patient's family).  Yes   Patient/family informed of their freedom to choose among providers that offer the needed level of care, that participate in Medicare, Medicaid or managed care program needed by the patient, have an available bed and are willing to accept the patient.  Yes   Patient/family informed of [facility]'s ownership interest in [facility] and [facility], as well as of the fact that they are under no obligation to receive care at these facilities.  PASRR submitted to EDS on       PASRR number received on       Existing PASRR number confirmed on 08/10/15     FL2 transmitted to all facilities in geographic area requested by pt/family on 08/10/15     FL2 transmitted to all facilities within larger geographic area on       Patient informed that his/her managed care company has contracts with or will negotiate with certain facilities, including the following:        Yes   Patient/family informed of bed offers received.  Patient chooses bed at       Physician recommends and patient chooses bed at      Patient to be transferred to   on  .  Patient to be transferred to facility by       Patient family notified on   of transfer.  Name of family member notified:        PHYSICIAN Please sign FL2     Additional Comment:    _______________________________________________ Ladell Pier, LCSW 08/10/2015, 1:55  PM

## 2015-08-10 NOTE — Care Management Note (Signed)
Case Management Note  Patient Details  Name: ANNELI BING MRN: [id number] Date of Birth: July 06, 1946  Subjective/Objective:      Open Hartman Procedure               Action/Plan: Discharge planning, spoke with patient at bedside. Patient lives alone and does not have support from friends or family close by. Son lives in another town and is unable to assist. Patient thinks she would do best with support at [facility] for PT and wound care. Contacted CSW to assist with SNF placement. Provided patient with resources for [facility] and private duty if SNF not option. Provided patient with my contact information if she has further questions. Will follow up later.    Expected Discharge Date:   (unknown)               Expected Discharge Plan:     In-House Referral:     Discharge planning Services     Post Acute Care Choice:    Choice offered to:     DME Arranged:    DME Agency:     HH Arranged:    [place] Agency:     Status of Service:     Medicare Important Message Given:    Date Medicare IM Given:    Medicare IM give by:    Date Additional Medicare IM Given:    Additional Medicare Important Message give by:     If discussed at [place] of Stay Meetings, dates discussed:    Additional Comments:  Guadalupe Maple, RN 08/10/2015, 10:59 AM

## 2015-08-11 NOTE — Progress Notes (Signed)
Patient ID: [name], female   DOB: 07-Jan-1946, 69 y.o.   MRN: [id number] [facility] Progress Note:   3 Days Post-Op  Subjective: Mental status is clear.  Up working on family issues.  Taking liquids Objective: Vital signs in last 24 hours: Temp:  [97.7 F (36.5 C)-98.7 F (37.1 C)] 98.1 F (36.7 C) (10/01 0600) Pulse Rate:  [49-86] 68 (10/01 0600) Resp:  [17-18] 18 (10/01 0600) BP: (106-137)/(42-74) 137/72 mmHg (10/01 0600) SpO2:  [95 %-100 %] 100 % (10/01 0600)  Intake/Output from previous day: 09/30 0701 - 10/01 0700 In: 2078.8 [P.O.:240; I.V.:1838.8] Out: 3700 [Urine:3300; Stool:400] Intake/Output this shift: Total I/O In: -  Out: 450 [Urine:300; Stool:150]  Physical Exam: Work of breathing is normal.  Ostomy is large and pink with liquid in it.  Lab Results:  Results for orders placed or performed during the hospital encounter of 08/06/15 (from the past 48 hour(s))  CBC     Status: Abnormal   Collection Time: 08/10/15  6:12 AM  Result Value Ref Range   WBC 12.3 (H) 4.0 - 10.5 K/uL   RBC 3.77 (L) 3.87 - 5.11 MIL/uL   Hemoglobin 10.6 (L) 12.0 - 15.0 g/dL   HCT 33.0 (L) 36.0 - 46.0 %   MCV 87.5 78.0 - 100.0 fL   MCH 28.1 26.0 - 34.0 pg   MCHC 32.1 30.0 - 36.0 g/dL   RDW 13.7 11.5 - 15.5 %   Platelets 237 150 - 400 K/uL  Basic metabolic panel     Status: Abnormal   Collection Time: 08/10/15  6:12 AM  Result Value Ref Range   Sodium 136 135 - 145 mmol/L   Potassium 3.5 3.5 - 5.1 mmol/L   Chloride 102 101 - 111 mmol/L   CO2 29 22 - 32 mmol/L   Glucose, Bld 94 65 - 99 mg/dL   BUN <5 (L) 6 - 20 mg/dL   Creatinine, Ser 0.44 0.44 - 1.00 mg/dL   Calcium 8.0 (L) 8.9 - 10.3 mg/dL   GFR calc non Af Amer >60 >60 mL/min   GFR calc Af Amer >60 >60 mL/min    Comment: (NOTE) The eGFR has been calculated using the CKD EPI equation. This calculation has not been validated in all clinical situations. eGFR's persistently <60 mL/min signify possible Chronic  Kidney Disease.    Anion gap 5 5 - 15    Radiology/Results: No results found.  Anti-infectives: Anti-infectives    Start     Dose/Rate Route Frequency Ordered Stop   08/08/15 0600  cefoTEtan (CEFOTAN) 2 g in dextrose 5 % 50 mL IVPB     2 g 100 mL/hr over 30 Minutes Intravenous On call to O.R. 08/07/15 1501 08/08/15 1103      Assessment/Plan: Problem List: Patient Active Problem List   Diagnosis Date Noted  . Colonic obstruction 08/06/2015  . Inguinal hernia with strangulation-right 06/03/2011  . High blood pressure 05/06/2011  . High cholesterol 05/06/2011  . Colon polyp/cancer 05/06/2011  . Wears glasses 05/06/2011  . Seizure 05/06/2011    Doing well.  Plan discharge on Monday to SNF.  Advance to regular diet.   3 Days Post-Op    LOS: 5 days   Matt B. Hassell Done, MD, [facility] [phone] beeper [phone]  08/11/2015 9:41 AM

## 2015-08-11 NOTE — Clinical Social Work Note (Signed)
Pt called CSW to clarify SNF bed protocol.  CSW provided pt with SNF bed process and also insurance authorization process.  CSW provided explanation regarding bed availability and options on the weekend with regards to insurance authorization.  CSW explained that SNF bed choice would be limited to insurance authorization and bed availability.  CSW explained private pay as an option should authorization not be made available to facilities this weekend and pt was being discharged over the weekend.  CSW agreed to follow up with pt today to obtain bed choice so follow up with insurance could be initiated.   Dede Query, LCSW [place] Worker - Weekend Coverage cell #: [phone]

## 2015-08-11 NOTE — Progress Notes (Signed)
Pt independently emptied colostomy bag, cleansed and resealed.

## 2015-08-12 MED ORDER — HYDROCODONE-ACETAMINOPHEN 5-325 MG PO TABS: 1.0000 | ORAL_TABLET | ORAL | Status: DC | PRN

## 2015-08-12 NOTE — Progress Notes (Signed)
Pt independently emptied, cleansed and resealed pouch several times this shift.

## 2015-08-12 NOTE — Clinical Social Work Note (Signed)
CSW spoke with pt who stated that her first choice for SNF is [facility] and she wants a private room  Second choice is [facility] and 3rd is [facility].  Pt only wants a private room so availability for SNF and private as well as obtaining authorization will be decided when she is ready for discharge.  MD note reflects pt may be ready for discharge on Monday.  Dede Query, LCSW [place] Worker - Weekend Coverage cell #: [phone]

## 2015-08-12 NOTE — Progress Notes (Signed)
Patient ID: [name], female   DOB: 07/16/1946, 69 y.o.   MRN: [id number] [facility] Progress Note:   3 Days Post-Op  Subjective: Tolerating a diet.  Having some trouble with the percocet making her too sleepy.  Ambulating well Objective: Vital signs in last 24 hours: Temp:  [98.1 F (36.7 C)-98.2 F (36.8 C)] 98.2 F (36.8 C) (10/02 0512) Pulse Rate:  [67-80] 75 (10/02 0512) Resp:  [16-18] 16 (10/02 0512) BP: (127-138)/(70-76) 128/76 mmHg (10/02 0512) SpO2:  [99 %-100 %] 99 % (10/02 0512)  Intake/Output from previous day: 10/01 0701 - 10/02 0700 In: 2001.3 [P.O.:240; I.V.:1761.3] Out: 1450 [Urine:950; Stool:500] Intake/Output this shift:    Physical Exam: NAD.  Work of breathing is normal. ABD soft Ostomy is large and beefy red with liquid output Incision: Clean dry and intact  Lab Results:  No results found for this or any previous visit (from the past 48 hour(s)).  Radiology/Results: No results found.  Anti-infectives: Anti-infectives    Start     Dose/Rate Route Frequency Ordered Stop   08/08/15 0600  cefoTEtan (CEFOTAN) 2 g in dextrose 5 % 50 mL IVPB     2 g 100 mL/hr over 30 Minutes Intravenous On call to O.R. 08/07/15 1501 08/08/15 1103      Assessment/Plan: Problem List: Patient Active Problem List   Diagnosis Date Noted  . Colonic obstruction ([place]) 08/06/2015  . Inguinal hernia with strangulation-right 06/03/2011  . High blood pressure 05/06/2011  . High cholesterol 05/06/2011  . Colon polyp/cancer 05/06/2011  . Wears glasses 05/06/2011  . Seizure ([place]) 05/06/2011    Doing well.  Plan on discharge on Monday to SNF.  Cont regular diet.  Will try switching to vicodin for pain 3 Days Post-Op    LOS: 6 days   Rosario Adie, MD  Colorectal and General Surgery [facility]   08/12/2015 11:12 AM

## 2015-08-12 NOTE — Consult Note (Signed)
WOC ostomy follow up Stoma type/location: LLQ colostomy Stomal assessment/size: 2 and 3/4 inches round, moist, bedded, edematous Peristomal assessment: Intact, clear Treatment options for stomal/peristomal skin: Skin barrier ring(s).  Two used today. Output Brown effluent, thickening slightly Ostomy pouching: 2pc., 4-inch pouching system with skin barrier rings. Education provided: Patient is now independent in emptying her pouch.  Observes pouch change as best she can and assists with placing traction on her skin to smooth out wrinkles during placement.  Additional supplies at bedside in current and next sized smaller version of two piece pouching system, the 2 and 3/4 inch system. Resources explained to patient including Secure Start and how to obtain pouches following discharge from the Rehab facility. Reviewed ostomy characteristics, pouch characteristics.  BRAT diet for thickening of stool. Patient is receptive and more able to retain information today than in previous sessions.  Is taking written notes. Enrolled patient in [place] Start Discharge program: No. [facility] nursing team will follow, and will remain available to this patient, the nursing, surgical and medical teams.   Thanks, Maudie Flakes, MSN, RN, [place], [place], [place] [phone])  Time spent with patient = 65 minutes

## 2015-08-13 MED ORDER — TRAMADOL HCL 50 MG PO TABS
50.0000 mg | ORAL_TABLET | Freq: Two times a day (BID) | ORAL | Status: DC | PRN
  Administered 2015-08-13: 50 mg via ORAL
  Filled 2015-08-13: qty 1

## 2015-08-13 MED ORDER — TRAMADOL HCL 50 MG PO TABS: 50.0000 mg | ORAL_TABLET | Freq: Four times a day (QID) | ORAL | Status: DC | PRN

## 2015-08-13 MED ORDER — ACETAMINOPHEN 325 MG PO TABS: 650.0000 mg | ORAL_TABLET | Freq: Four times a day (QID) | ORAL | Status: DC | PRN

## 2015-08-13 NOTE — Clinical Social Work Placement (Signed)
   CLINICAL SOCIAL WORK PLACEMENT  NOTE  Date:  08/13/2015  Patient Details  Name: [name] MRN: [id number] Date of Birth: March 05, 1946  Clinical Social Work is seeking post-discharge placement for this patient at the [employer] level of care (*CSW will initial, date and re-position this form in  chart as items are completed):  Yes   Patient/family provided with [facility] Work Department's list of facilities offering this level of care within the geographic area requested by the patient (or if unable, by the patient's family).  Yes   Patient/family informed of their freedom to choose among providers that offer the needed level of care, that participate in Medicare, Medicaid or managed care program needed by the patient, have an available bed and are willing to accept the patient.  Yes   Patient/family informed of [facility]'s ownership interest in [facility] and [facility], as well as of the fact that they are under no obligation to receive care at these facilities.  PASRR submitted to EDS on       PASRR number received on       Existing PASRR number confirmed on 08/10/15     FL2 transmitted to all facilities in geographic area requested by pt/family on 08/10/15     FL2 transmitted to all facilities within larger geographic area on       Patient informed that his/her managed care company has contracts with or will negotiate with certain facilities, including the following:            Patient/family informed of bed offers received.  Patient chooses bed at [facility] recommends and patient chooses bed at      Patient to be transferred to [facility] on 08/13/15.  Patient to be transferred to facility by [facility]     Patient family notified on 08/13/15 of transfer.  Name of family member notified:  sister in law     PHYSICIAN Please sign FL2     Additional Comment: Pt / family are in  agreement with d/c to [facility] today. PT approved transport by car. NSG reviewed d/c summary, scripts, avs. Scripts included in d/c packet. D/C sent to SNF prior to d/c for review. D/C packet provided to pt prior to d/c.   _______________________________________________ Luretha Rued, LCSW 08/13/2015, 3:26 PM

## 2015-08-13 NOTE — Discharge Summary (Signed)
Physician Discharge Summary  Patient ID: [name] MRN: [id number] DOB/AGE: Mar 06, 1946 69 y.o.  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 Leighton Ruff POD 1 Hx of hypertension Hx of GERD Hx of diverticulosis Hx of stomach resection and LIH repair  Principal Problem:   Colonic obstruction ([place]) Active Problems:   High blood pressure   High cholesterol   PROCEDURES:  FLEXIBLE SIGMOIDOSCOPY, 08/07/15, Dr. Teena Irani (Findings:  diverticulosis,nondiagnostic procedure but suspect high-grade obstruction at the rectosigmoid junction , possibly compatible with diverticulitis or diverticular stricture. No neoplasm visualized.)  Open [facility]man Procedure (Sigmoid Resection with Colostomy) 08/08/15, DR. Leighton Ruff  PATHOLOGY:  Colon, segmental resection, sigmoid - DIVERTICULITIS WITH STRICTURE. - RESECTION MARGINS VIABLE. - FOUR BENIGN LYMPH NODES. - NO DYSPLASIA OR MALIGNANCY.  Hospital Course:  She is referred to our office urgently by Dr. 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 She's been having abdominal distension on and off for 2 months she states. She has been eating less and has lost 20 pounds. She has occasional nausea. She is not passing much gas and only passes small amounts of liquid stool. A CT scan of the abdomen and pelvis demonstrates this high-grade stricture which they feel is secondary the level of a suture line however she's had a partial right colectomy in the past and no surgery, to my or her knowledge, on her sigmoid colon or rectum. She did have emergency surgery for an ectopic pregnancy in the distant past.   Pt was admitted and  seen by Dr. Amedeo Plenty who took her to Endoscopy the following day.  The results are above.  She was then prepped and taken to the OR on 9/28 with a Hartman procedure.  She has made good progress.  She has a large ostomy 2 and 3/4 inches round, moist, bedded, edematous;  that is shrinking. This will need to be addressed as the ostomy changes.  By 08/13/15, she was doing well and it was Dr. Sammie Bench opinion that she was ready for transfer to Rehab. CBC Latest Ref Rng 08/10/2015 08/08/2015 08/06/2015  WBC 4.0 - 10.5 K/uL 12.3(H) 5.1 9.0  Hemoglobin 12.0 - 15.0 g/dL 10.6(L) 12.5 14.7  Hematocrit 36.0 - 46.0 % 33.0(L) 37.9 43.9  Platelets 150 - 400 K/uL 237 271 369   CMP Latest Ref Rng 08/10/2015 08/08/2015 08/06/2015  Glucose 65 - 99 mg/dL 94 109(H) 100(H)  BUN 6 - 20 mg/dL <5(L) <5(L) 7  Creatinine 0.44 - 1.00 mg/dL 0.44 0.47 0.71  Sodium 135 - 145 mmol/L 136 138 136  Potassium 3.5 - 5.1 mmol/L 3.5 4.1 2.9(L)  Chloride 101 - 111 mmol/L 102 109 98(L)  CO2 22 - 32 mmol/L 29 25 29   Calcium 8.9 - 10.3 mg/dL 8.0(L) 8.1(L) 9.1  Total Protein 6.5 - 8.1 g/dL - - 7.0  Total Bilirubin 0.3 - 1.2 mg/dL - - 1.0  Alkaline Phos 38 - 126 U/L - - 69  AST 15 - 41 U/L - - 24  ALT 14 - 54 U/L - - 17    Condition on D/C:  Improved    Disposition: 01-Home or Self Care     Medication List    TAKE these medications  acetaminophen 325 MG tablet  Commonly known as:  TYLENOL  Take 2 tablets (650 mg total) by mouth every 6 (six) hours as needed.     aspirin 81 MG tablet  Take 81 mg by mouth daily.     atorvastatin 10 MG tablet  Commonly known as:  LIPITOR  Take 10 mg by mouth every other day.     cholecalciferol 1000 UNITS tablet  Commonly known as:  VITAMIN D  Take 1,000 Units by mouth daily.     clonazePAM 1 MG tablet  Commonly known as:  KLONOPIN  Take 0.5 mg by mouth 3 (three) times daily as needed for anxiety. For anxiety.     fluticasone 50 MCG/ACT nasal spray  Commonly known as:  FLONASE   Place 2 sprays into the nose daily.     hydrochlorothiazide 25 MG tablet  Commonly known as:  HYDRODIURIL  Take 12.5 mg by mouth daily.     Melatonin 5 MG Tabs  Take 5 mg by mouth at bedtime.     metoprolol succinate 50 MG 24 hr tablet  Commonly known as:  TOPROL-XL  Take 50 mg by mouth daily.     multivitamin with minerals Tabs tablet  Take 1 tablet by mouth daily.     ondansetron 4 MG tablet  Commonly known as:  ZOFRAN  Take 4 mg by mouth every 8 (eight) hours as needed. For nausea.     tetrahydrozoline 0.05 % ophthalmic solution  Place 1 drop into both eyes 2 (two) times daily as needed (allergy irritation or dry eyes.).     traMADol 50 MG tablet  Commonly known as:  ULTRAM  Take 1-2 tablets (50-100 mg total) by mouth every 6 (six) hours as needed for moderate pain.       Follow-up Information    Follow up with Rosario Adie., MD On 44/31/5400.   Specialty:  General Surgery   Why:  12:10pm, arrive by 11:55am for check in   Contact information:   [address] [place] [address] [phone]       Follow up with [facility] On 08/17/2015.   Specialty:  General Surgery   Why:  For suture removal, you will see the nurse.  2:30pm, arrive by 2:15 for check in   Contact information:   [address] [place] [address] [phone]       Signed: Earnstine Regal 08/13/2015, 9:15 AM

## 2015-08-16 ENCOUNTER — Non-Acute Institutional Stay (SKILLED_NURSING_FACILITY): Payer: 59 | Admitting: Internal Medicine

## 2015-08-16 DIAGNOSIS — R600 Localized edema: Secondary | ICD-10-CM

## 2015-08-16 DIAGNOSIS — I10 Essential (primary) hypertension: Secondary | ICD-10-CM

## 2015-08-16 DIAGNOSIS — E7800 Pure hypercholesterolemia, unspecified: Secondary | ICD-10-CM

## 2015-08-16 DIAGNOSIS — K56609 Unspecified intestinal obstruction, unspecified as to partial versus complete obstruction: Secondary | ICD-10-CM

## 2015-08-16 DIAGNOSIS — K5660 Unspecified intestinal obstruction: Secondary | ICD-10-CM | POA: Diagnosis not present

## 2015-08-16 DIAGNOSIS — E559 Vitamin D deficiency, unspecified: Secondary | ICD-10-CM | POA: Diagnosis not present

## 2015-08-16 NOTE — Progress Notes (Signed)
MRN: [id number] Name: SANIYAH MONDESIR  Sex: female Age: 69 y.o. DOB: 24-Nov-1945  [place] #: Helene Kelp Facility/Room:107 Level Of Care: SNF Provider: Inocencio Homes D Emergency Contacts: Extended Emergency Contact Information Primary Emergency Contact: Holt,Alan R Address: [address] [place] [address] [place] Phone: [phone] Relation: Son  Code Status:   Allergies: Codeine; Promethazine hcl; and Simvastatin  Chief Complaint  Patient presents with  . New Admit To SNF    HPI: Patient is 70 y.o. female with HTN and HLD was admitted to the hospital after being dx with a high grade stricture at the colorectal junction and massive colonic distention. after having abdominal distension on and off for 2 months, eating less and has losing 20 pounds. Pt was admitted to the hospital from 9/26-10/3 where she underwent surgery and a colostomy was placed. She had an uneventful post-op period. She is being admitted to SNF for generalized weakness and post-op care. While at SNF pt will be followed for HTN, tx with HCTZ and metoprolol, HLD, tx with lipitor and Vitamin D deficiency treated with vitamin d supplement.  Past Medical History  Diagnosis Date  . Hypertension   . Hyperlipidemia   . Hernia   . Ectopic pregnancy 1974  . Inguinal hernia unilateral, non-recurrent     right, strangulated-right colon  . Colon polyp     Past Surgical History  Procedure Laterality Date  . Ectopic pregnancy surgery  1974  . Tonsilectomy, adenoidectomy, bilateral myringotomy and tubes  1950  . Hernia repair  2012    right inguinal  . Colon surgery      right colectomy  . Colectomy with colostomy creation/hartmann procedure N/A 08/08/2015    Procedure: Open [place] Procedure, Sigmoid Resection, Colostomy;  Surgeon: Leighton Ruff, MD;  Location: [facility] ORS;  Service: General;  Laterality: N/A;  . Flexible sigmoidoscopy N/A 08/07/2015    Procedure: Beryle Quant;  Surgeon: Teena Irani, MD;  Location: [facility] ENDOSCOPY;  Service: Endoscopy;  Laterality: N/A;      Medication List       This list is accurate as of: 08/16/15 11:59 PM.  Always use your most recent med list.               acetaminophen 325 MG tablet  Commonly known as:  TYLENOL  Take 2 tablets (650 mg total) by mouth every 6 (six) hours as needed.     aspirin 81 MG tablet  Take 81 mg by mouth daily.     atorvastatin 10 MG tablet  Commonly known as:  LIPITOR  Take 10 mg by mouth every other day.     cholecalciferol 1000 UNITS tablet  Commonly known as:  VITAMIN D  Take 1,000 Units by mouth daily.     clonazePAM 1 MG tablet  Commonly known as:  KLONOPIN  Take 0.5 mg by mouth 3 (three) times daily as needed for anxiety. For anxiety.     fluticasone 50 MCG/ACT nasal spray  Commonly known as:  FLONASE  Place 2 sprays into the nose daily.     hydrochlorothiazide 25 MG tablet  Commonly known as:  HYDRODIURIL  Take 12.5 mg by mouth daily.     Melatonin 5 MG Tabs  Take 5 mg by mouth at bedtime.     metoprolol succinate 50 MG 24 hr tablet  Commonly known as:  TOPROL-XL  Take 50 mg by mouth daily.  multivitamin with minerals Tabs tablet  Take 1 tablet by mouth daily.     ondansetron 4 MG tablet  Commonly known as:  ZOFRAN  Take 4 mg by mouth every 8 (eight) hours as needed. For nausea.     tetrahydrozoline 0.05 % ophthalmic solution  Place 1 drop into both eyes 2 (two) times daily as needed (allergy irritation or dry eyes.).     traMADol 50 MG tablet  Commonly known as:  ULTRAM  Take 1-2 tablets (50-100 mg total) by mouth every 6 (six) hours as needed for moderate pain.        No orders of the defined types were placed in this encounter.    Immunization History  Administered Date(s) Administered  . Tdap 11/12/2012    Social History  Substance Use Topics  . Smoking status: Never Smoker   . Smokeless tobacco: Never Used  . Alcohol Use: 0.6  oz/week    1 Glasses of wine per week     Comment: socially    Family history is + HTN, HLD   Review of Systems  DATA OBTAINED: from patient, son GENERAL:  no fevers, fatigue, appetite changes SKIN: No itching, rash or wounds EYES: No eye pain, redness, discharge EARS: No earache, tinnitus, change in hearing NOSE: No congestion, drainage or bleeding  MOUTH/THROAT: No mouth or tooth pain, No sore throat RESPIRATORY: No cough, wheezing, SOB CARDIAC: No chest pain, palpitations, some concern for mild extremity edema  GI: No abdominal pain, No N/V/D or constipation, No heartburn or reflux  GU: No dysuria, frequency or urgency, or incontinence  MUSCULOSKELETAL: No unrelieved bone/joint pain NEUROLOGIC: No headache, dizziness or focal weakness PSYCHIATRIC: No c/o anxiety or sadness   Filed Vitals:   08/19/15 1331  BP: 138/80  Pulse: 77  Temp: 97.6 F (36.4 C)  Resp: 18    SpO2 Readings from Last 1 Encounters:  08/13/15 100%        Physical Exam  GENERAL APPEARANCE: Alert, conversant,  No acute distress.  SKIN: No diaphoresis rash HEAD: Normocephalic, atraumatic  EYES: Conjunctiva/lids clear. Pupils round, reactive. EOMs intact.  EARS: External exam WNL, canals clear. Hearing grossly normal.  NOSE: No deformity or discharge.  MOUTH/THROAT: Lips w/o lesions  RESPIRATORY: Breathing is even, unlabored. Lung sounds are clear   CARDIOVASCULAR: Heart RRR no murmurs, rubs or gallops. Trace  peripheral edema mid calf to ankle  GASTROINTESTINAL: Abdomen is soft, non-tender, not distended w/ normal bowel sounds: colostomy in place GENITOURINARY: Bladder non tender, not distended  MUSCULOSKELETAL: No abnormal joints or musculature NEUROLOGIC:  Cranial nerves 2-12 grossly intact. Moves all extremities  PSYCHIATRIC: Mood and affect appropriate to situation, no behavioral issues  Patient Active Problem List   Diagnosis Date Noted  . Vitamin D deficiency 08/19/2015  . Pedal  edema 08/19/2015  . Colonic obstruction ([place]) 08/06/2015  . Inguinal hernia with strangulation-right 06/03/2011  . High blood pressure 05/06/2011  . High cholesterol 05/06/2011  . Colon polyp/cancer 05/06/2011  . Wears glasses 05/06/2011  . Seizure ([place]) 05/06/2011    CBC    Component Value Date/Time   WBC 12.3* 08/10/2015 0612   RBC 3.77* 08/10/2015 0612   HGB 10.6* 08/10/2015 0612   HCT 33.0* 08/10/2015 0612   PLT 237 08/10/2015 0612   MCV 87.5 08/10/2015 0612   LYMPHSABS 0.3* 11/12/2012 1337   MONOABS 0.5 11/12/2012 1337   EOSABS 0.0 11/12/2012 1337   BASOSABS 0.0 11/12/2012 1337    CMP  Component Value Date/Time   NA 136 08/10/2015 0612   K 3.5 08/10/2015 0612   CL 102 08/10/2015 0612   CO2 29 08/10/2015 0612   GLUCOSE 94 08/10/2015 0612   BUN <5* 08/10/2015 0612   CREATININE 0.44 08/10/2015 0612   CALCIUM 8.0* 08/10/2015 0612   PROT 7.0 08/06/2015 1648   ALBUMIN 3.9 08/06/2015 1648   AST 24 08/06/2015 1648   ALT 17 08/06/2015 1648   ALKPHOS 69 08/06/2015 1648   BILITOT 1.0 08/06/2015 1648   GFRNONAA >60 08/10/2015 0612   GFRAA >60 08/10/2015 0612    No results found for: HGBA1C   Dg Abd Acute W/chest  08/06/2015   CLINICAL DATA:  Admitted to hospital today with severe abdominal pain, nausea, and abdominal distension which has been occurring off and on for 2 months, not passing gas, only passing small amount of liquid stool, 20 lb weight loss over 2 months, anorexia  EXAM: DG ABDOMEN ACUTE W/ 1V CHEST  COMPARISON:  08/02/2015 CT abdomen and pelvis  FINDINGS: Normal heart size, mediastinal contours and pulmonary vascularity.  Emphysematous and bronchitic changes consistent with COPD.  Significant elevation of LEFT diaphragm.  Mild LEFT basilar atelectasis.  No definite infiltrate, pleural effusion or pneumothorax.  Diffuse osseous demineralization.  Significant gaseous distention of the colon with cecum up to 14.8 cm diameter.  No definite free intraperitoneal  air identified.  Minimal small bowel gas dilatation.  No definite bowel wall thickening.  Bones severely demineralized with levoconvex thoracolumbar scoliosis and scattered degenerative changes.  IMPRESSION: Significant gaseous distention of the colon to the level of the sigmoid colon; prior CT exam demonstrated a rectosigmoid obstruction.  Cecum measures approximately 14.8 cm transverse diameter but no definite free intraperitoneal air is identified.  Findings called to [facility] on 5W on 08/06/2015 at 1722 hour.   Electronically Signed   By: Lavonia Dana M.D.   On: 08/06/2015 17:24    Not all labs, radiology exams or other studies done during hospitalization come through on my EPIC note; however they are reviewed by me.    Assessment and Plan  Colonic obstruction ([place]) S/p hartman procedure for stricture with obstruction and placement of colostomy; SNF - OT/PT, colostomy care teaching  High blood pressure SNF - stable on metoprolol XL 50 mg daily and HCTZ 12.5 mg daily  High cholesterol SNF - cont lipitor 10 mg daily  Vitamin D deficiency SNF - cont Vit D supplement 1000 u daily  Pedal edema SNF - trace, pt is woried, TED hose   Time spent 35 min;> 50% of time with patient was spent reviewing records, labs, tests and studies, counseling and developing plan of care  Hennie Duos, MD

## 2015-08-19 ENCOUNTER — Encounter: Payer: Self-pay | Admitting: Internal Medicine

## 2015-08-19 DIAGNOSIS — R600 Localized edema: Secondary | ICD-10-CM | POA: Insufficient documentation

## 2015-08-19 DIAGNOSIS — E559 Vitamin D deficiency, unspecified: Secondary | ICD-10-CM | POA: Insufficient documentation

## 2015-08-19 NOTE — Assessment & Plan Note (Signed)
SNF - cont Vit D supplement 1000 u daily

## 2015-08-19 NOTE — Assessment & Plan Note (Signed)
S/p hartman procedure for stricture with obstruction and placement of colostomy; SNF - OT/PT, colostomy care teaching

## 2015-08-19 NOTE — Assessment & Plan Note (Signed)
SNF - stable on metoprolol XL 50 mg daily and HCTZ 12.5 mg daily

## 2015-08-19 NOTE — Assessment & Plan Note (Signed)
SNF - trace, pt is woried, TED hose

## 2015-08-19 NOTE — Assessment & Plan Note (Signed)
SNF - cont lipitor 10 mg daily

## 2015-08-20 ENCOUNTER — Non-Acute Institutional Stay (SKILLED_NURSING_FACILITY): Payer: 59 | Admitting: Internal Medicine

## 2015-08-20 ENCOUNTER — Encounter: Payer: Self-pay | Admitting: Internal Medicine

## 2015-08-20 DIAGNOSIS — I10 Essential (primary) hypertension: Secondary | ICD-10-CM | POA: Diagnosis not present

## 2015-08-20 DIAGNOSIS — E7800 Pure hypercholesterolemia, unspecified: Secondary | ICD-10-CM | POA: Diagnosis not present

## 2015-08-20 DIAGNOSIS — R600 Localized edema: Secondary | ICD-10-CM

## 2015-08-20 DIAGNOSIS — E559 Vitamin D deficiency, unspecified: Secondary | ICD-10-CM

## 2015-08-20 DIAGNOSIS — K5660 Unspecified intestinal obstruction: Secondary | ICD-10-CM

## 2015-08-20 DIAGNOSIS — K56609 Unspecified intestinal obstruction, unspecified as to partial versus complete obstruction: Secondary | ICD-10-CM

## 2015-08-20 NOTE — Progress Notes (Signed)
MRN: [id number] Name: [name]  Sex: female Age: 69 y.o. DOB: [birth year]  [place] #: Helene Kelp Facility/Room: 106 Level Of Care: SNF Provider: Inocencio Homes D Emergency Contacts: Extended Emergency Contact Information Primary Emergency Contact: Schubert,Alan R Address: [address] [place] [address] [place] Phone: [phone] Relation: Son  Code Status:   Allergies: Codeine; Promethazine hcl; and Simvastatin  Chief Complaint  Patient presents with  . Discharge Note    HPI: Patient is 69 y.o. female who  female with HTN and HLD was admitted to the hospital after being dx with a high grade stricture at the colorectal junction, s/p colectomy and colostomy. Pt was admitted to SNF for OT/PT and for colostomy care. She is stable for d/c to home on 10/10.  Past Medical History  Diagnosis Date  . Hypertension   . Hyperlipidemia   . Hernia   . Ectopic pregnancy 1974  . Inguinal hernia unilateral, non-recurrent     right, strangulated-right colon  . Colon polyp     Past Surgical History  Procedure Laterality Date  . Ectopic pregnancy surgery  1974  . Tonsilectomy, adenoidectomy, bilateral myringotomy and tubes  1950  . Hernia repair  2012    right inguinal  . Colon surgery      right colectomy  . Colectomy with colostomy creation/hartmann procedure N/A 08/08/2015    Procedure: Open [place] Procedure, Sigmoid Resection, Colostomy;  Surgeon: Leighton Ruff, MD;  Location: [facility];  Service: General;  Laterality: N/A;  . Flexible sigmoidoscopy N/A 08/07/2015    Procedure: Beryle Quant;  Surgeon: Teena Irani, MD;  Location: [facility];  Service: Endoscopy;  Laterality: N/A;      Medication List       This list is accurate as of: 08/20/15  4:36 PM.  Always use your most recent med list.               acetaminophen 325 MG tablet  Commonly known as:  TYLENOL  Take 2 tablets (650 mg total) by mouth every 6 (six) hours as  needed.     aspirin 81 MG tablet  Take 81 mg by mouth daily.     atorvastatin 10 MG tablet  Commonly known as:  LIPITOR  Take 10 mg by mouth every other day.     cholecalciferol 1000 UNITS tablet  Commonly known as:  VITAMIN D  Take 1,000 Units by mouth daily.     clonazePAM 1 MG tablet  Commonly known as:  KLONOPIN  Take 0.5 mg by mouth 3 (three) times daily as needed for anxiety. For anxiety.     fluticasone 50 MCG/ACT nasal spray  Commonly known as:  FLONASE  Place 2 sprays into the nose daily.     hydrochlorothiazide 25 MG tablet  Commonly known as:  HYDRODIURIL  Take 12.5 mg by mouth daily.     Melatonin 5 MG Tabs  Take 5 mg by mouth at bedtime.     metoprolol succinate 50 MG 24 hr tablet  Commonly known as:  TOPROL-XL  Take 50 mg by mouth daily.     multivitamin with minerals Tabs tablet  Take 1 tablet by mouth daily.     ondansetron 4 MG tablet  Commonly known as:  ZOFRAN  Take 4 mg by mouth every 8 (eight) hours as needed. For nausea.     tetrahydrozoline 0.05 % ophthalmic solution  Place 1 drop into both  eyes 2 (two) times daily as needed (allergy irritation or dry eyes.).     traMADol 50 MG tablet  Commonly known as:  ULTRAM  Take 1-2 tablets (50-100 mg total) by mouth every 6 (six) hours as needed for moderate pain.        No orders of the defined types were placed in this encounter.    Immunization History  Administered Date(s) Administered  . Tdap 11/12/2012    Social History  Substance Use Topics  . Smoking status: Never Smoker   . Smokeless tobacco: Never Used  . Alcohol Use: 0.6 oz/week    1 Glasses of wine per week     Comment: socially    Filed Vitals:   08/20/15 1631  BP: 128/76  Pulse: 78  Temp: 98.6 F (37 C)  Resp: 18    Physical Exam  GENERAL APPEARANCE: Alert, conversant. No acute distress.  HEENT: Unremarkable. RESPIRATORY: Breathing is even, unlabored. Lung sounds are clear   CARDIOVASCULAR: Heart RRR no  murmurs, rubs or gallops. No peripheral edema.  GASTROINTESTINAL: Abdomen is soft, non-tender, not distended w/ normal bowel sounds, colostomy in place  NEUROLOGIC: Cranial nerves 2-12 grossly intact. Moves all extremities  Patient Active Problem List   Diagnosis Date Noted  . Vitamin D deficiency 08/19/2015  . Pedal edema 08/19/2015  . Colonic obstruction ([place]) 08/06/2015  . Inguinal hernia with strangulation-right 06/03/2011  . High blood pressure 05/06/2011  . High cholesterol 05/06/2011  . Colon polyp/cancer 05/06/2011  . Wears glasses 05/06/2011  . Seizure ([place]) 05/06/2011    CBC    Component Value Date/Time   WBC 12.3* 08/10/2015 0612   RBC 3.77* 08/10/2015 0612   HGB 10.6* 08/10/2015 0612   HCT 33.0* 08/10/2015 0612   PLT 237 08/10/2015 0612   MCV 87.5 08/10/2015 0612   LYMPHSABS 0.3* 11/12/2012 1337   MONOABS 0.5 11/12/2012 1337   EOSABS 0.0 11/12/2012 1337   BASOSABS 0.0 11/12/2012 1337    CMP     Component Value Date/Time   NA 136 08/10/2015 0612   K 3.5 08/10/2015 0612   CL 102 08/10/2015 0612   CO2 29 08/10/2015 0612   GLUCOSE 94 08/10/2015 0612   BUN <5* 08/10/2015 0612   CREATININE 0.44 08/10/2015 0612   CALCIUM 8.0* 08/10/2015 0612   PROT 7.0 08/06/2015 1648   ALBUMIN 3.9 08/06/2015 1648   AST 24 08/06/2015 1648   ALT 17 08/06/2015 1648   ALKPHOS 69 08/06/2015 1648   BILITOT 1.0 08/06/2015 1648   GFRNONAA >60 08/10/2015 0612   GFRAA >60 08/10/2015 0612    Assessment and Plan  Pt is stable to d/c to home with HH/OT/PT and nursing for colostomy teaching. Pt had an uneventful SNF stay. Prescriptions were written.  Hennie Duos, MD

## 2015-10-08 ENCOUNTER — Other Ambulatory Visit: Payer: Self-pay | Admitting: General Surgery

## 2015-10-08 NOTE — H&P (Signed)
History of Present Illness Emily Ruff MD; Q000111Q 2:34 PM) Patient words: reck.  The patient is a 69 year old female who presents with diverticulitis. 69 year old female who presented to the hospital with a complete colonic obstruction. She was taken to the operating room and a Hartman's procedure was performed after a flexible sigmoidoscopy showed no signs of cancer. She tolerated this well. Since that time she has been gaining weight and tolerating a diet without difficulty. She is getting used to pouching her ostomy correctly. She is currently still out of work. She is seeing a therapist to help emotionally process her recent illness.   Problem List/Past Medical Emily Ruff, MD; Q000111Q 2:38 PM) POST-OPERATIVE STATE [phone]) DIVERTICULITIS WITH OBSTRUCTION (K57.92) COLONIC OBSTRUCTION (K56.60) ENCOUNTER FOR REMOVAL OF STAPLES (Z48.02)  Other Problems Emily Ruff, MD; Q000111Q 2:38 PM) Transfusion history Oophorectomy Right. Inguinal Hernia Gastroesophageal Reflux Disease Diverticulosis Hypercholesterolemia High blood pressure Depression Anxiety Disorder  Past Surgical History Emily Ruff, MD; Q000111Q 2:38 PM) Tonsillectomy Resection of Stomach Laparoscopic Inguinal Hernia Surgery Right. Breast Biopsy Left.  Diagnostic Studies History Emily Ruff, MD; Q000111Q 2:38 PM) Mammogram 1-3 years ago Colonoscopy >10 years ago Pap Smear 1-5 years ago  Allergies Marjean Donna, Russell; 10/08/2015 1:55 PM) Codeine Phosphate *ANALGESICS - OPIOID* Promethazine HCl *CHEMICALS* Simvastatin *ANTIHYPERLIPIDEMICS* No Known Drug Allergies 09/04/2015 (Marked as Inactive)  Medication History (Sonya Bynum, CMA; 10/08/2015 1:55 PM) ClonazePAM (1MG  Tablet, Oral) Active. Atorvastatin Calcium (10MG  Tablet, Oral) Active. Metoprolol Succinate ER (50MG  Tablet ER 24HR, Oral) Active. Hydrochlorothiazide (25MG  Tablet, Oral)  Active. Ondansetron HCl (4MG  Tablet, Oral) Active. Vitamin D (Cholecalciferol) (1000UNIT Capsule, Oral) Active. Biotin (1MG  Capsule, Oral) Active. Flonase (50MCG/ACT Suspension, Nasal as needed) Active. Aspirin (81MG  Tablet Chewable, Oral) Active. Medications Reconciled  Social History Emily Ruff, MD; Q000111Q 2:38 PM) No drug use Caffeine use Carbonated beverages, Coffee, Tea. Alcohol use Occasional alcohol use.  Family History Emily Ruff, MD; Q000111Q 2:38 PM) Depression Mother, Sister. Arthritis Father. Hypertension Father, Mother. Heart Disease Mother. Ischemic Bowel Disease Father.  Pregnancy / Birth History Emily Ruff, MD; Q000111Q 2:38 PM) Age of menopause 57-55 Age at menarche 44 years. Gravida 2 Contraceptive History Oral contraceptives. Para 1 Maternal age 11-30     Review of Systems Emily Ruff MD; Q000111Q 2:39 PM) General Present- Appetite Loss and Weight Loss. Not Present- Chills, Fatigue, Fever, Night Sweats and Weight Gain. Skin Present- Change in Wart/Mole. Not Present- Dryness, Hives, Jaundice, New Lesions, Non-Healing Wounds, Rash and Ulcer. HEENT Present- Seasonal Allergies and Wears glasses/contact lenses. Not Present- Earache, Hearing Loss, Hoarseness, Nose Bleed, Oral Ulcers, Ringing in the Ears, Sinus Pain, Sore Throat, Visual Disturbances and Yellow Eyes. Gastrointestinal Not Present- Abdominal Pain, Bloating, Bloody Stool, Change in Bowel Habits, Chronic diarrhea, Constipation, Difficulty Swallowing, Excessive gas, Hemorrhoids, Indigestion, Nausea, Rectal Pain and Vomiting. Female Genitourinary Not Present- Frequency, Nocturia, Painful Urination, Pelvic Pain and Urgency. Musculoskeletal Not Present- Back Pain, Joint Pain, Joint Stiffness, Muscle Pain, Muscle Weakness and Swelling of Extremities. Neurological Not Present- Decreased Memory, Fainting, Headaches, Numbness, Seizures, Tingling, Tremor, Trouble  walking and Weakness. Psychiatric Present- Anxiety. Not Present- Bipolar, Change in Sleep Pattern, Depression, Fearful and Frequent crying. Endocrine Not Present- Cold Intolerance, Excessive Hunger, Hair Changes, Heat Intolerance, Hot flashes and New Diabetes. Hematology Not Present- Easy Bruising, Excessive bleeding, Gland problems, HIV and Persistent Infections.  Vitals (Sonya Bynum CMA; 10/08/2015 1:55 PM) 10/08/2015 1:55 PM Weight: 122 lb Height: 65in Body Surface Area: 1.6 m Body Mass Index: 20.3 kg/m  Temp.: 98.64F(Temporal)  Pulse:  81 (Regular)  BP: 132/72 (Sitting, Left Arm, Standard)      Physical Exam Emily Ruff MD; Q000111Q 2:38 PM)  The physical exam findings are as follows: Note:General: Thin female in NAD. Pleasant and cooperative.  HEENT: [place]/AT, no facial masses  EYES: EOMI, no icterus  CV: RRR, no murmur, no JVD.  CHEST: Breath sounds equal and clear. Respirations nonlabored.  ABDOMEN: Soft, non-tender. midline incision healed. ostomy pink and viable  GU: Right groin scar  MUSCULOSKELETAL: FROM, good muscle tone, no edema, distal venous stasis changes noted bilaterally  SKIN: No jaundice or suspicious rashes.  NEUROLOGIC: Alert and oriented, answers questions appropriately, normal gait and station.  PSYCHIATRIC: Normal mood, affect , and behavior.    Assessment & Plan Emily Ruff MD; Q000111Q 2:34 PM)  DIVERTICULITIS WITH OBSTRUCTION (K57.92) Impression: Patient is now 2 months status post open sigmoidectomy for diverticular stricture. She is ready to have her ostomy reversed. I have recommended that she undergo a completion colonoscopy of her colon proximal to the ostomy prior to surgery. Hopefully we can do this the day before the surgery and avoid to separate bowel preps. We discussed the possibility of doing this in a minimally invasive fashion. I think she has a relatively high likelihood of getting it done this  way. If we are unable to do this safely, we will convert to an open incision through her previous surgical site. We discussed the risk of surgery which include bleeding, infection, damage to adjacent structures and hernia. I believe that she understands these risks and has agreed to proceed with surgery.

## 2015-11-01 NOTE — Patient Instructions (Addendum)
YOUR PROCEDURE IS SCHEDULED ON : 11/13/14  REPORT TO [facility] MAIN ENTRANCE FOLLOW SIGNS TO EAST ELEVATOR - GO TO 3rd FLOOR CHECK IN AT 3 EAST NURSES STATION (SHORT STAY) AT: 10:30 am  CALL THIS NUMBER IF YOU HAVE PROBLEMS THE MORNING OF SURGERY [phone]  REMEMBER:ONLY 1 PER PERSON MAY GO TO SHORT STAY WITH YOU TO GET READY THE MORNING OF YOUR SURGERY  DO NOT EAT FOOD OR DRINK LIQUIDS AFTER MIDNIGHT  MAY HAVE WATER UNTIL 6:30 AM  TAKE THESE MEDICINES THE MORNING OF SURGERY: NONE  STOP ASPIRIN / IBUPROFEN / ALEVE / VITAMINS / HERBAL MEDS __5__ DAYS BEFORE SURGERY  FOLLOW BOWEL PREP AS DIRECTED FROM OFFICE  YOU MAY NOT HAVE ANY METAL ON YOUR BODY INCLUDING HAIR PINS AND PIERCING'S. DO NOT WEAR JEWELRY, MAKEUP, LOTIONS, POWDERS OR PERFUMES. DO NOT WEAR NAIL POLISH. DO NOT SHAVE 48 HRS PRIOR TO SURGERY. MEN MAY SHAVE FACE AND NECK.  DO NOT [place]. [place] [facility] IS NOT RESPONSIBLE FOR VALUABLES.  CONTACTS, DENTURES OR PARTIALS MAY NOT BE WORN TO SURGERY. LEAVE SUITCASE IN CAR. CAN BE BROUGHT TO ROOM AFTER SURGERY.  PATIENTS DISCHARGED THE DAY OF SURGERY WILL NOT BE ALLOWED TO DRIVE HOME.  PLEASE READ OVER THE FOLLOWING INSTRUCTION SHEETS _________________________________________________________________________________                                          [facility] - PREPARING FOR SURGERY  Before surgery, you can play an important role.  Because skin is not sterile, your skin needs to be as free of germs as possible.  You can reduce the number of germs on your skin by washing with CHG (chlorahexidine gluconate) soap before surgery.  CHG is an antiseptic cleaner which kills germs and bonds with the skin to continue killing germs even after washing. Please DO NOT use if you have an allergy to CHG or antibacterial soaps.  If your skin becomes reddened/irritated stop using the CHG and inform your nurse when you arrive at Short Stay. Do not  shave (including legs and underarms) for at least 48 hours prior to the first CHG shower.  You may shave your face. Please follow these instructions carefully:   1.  Shower with CHG Soap the night before surgery and the  morning of Surgery.   2.  If you choose to wash your hair, wash your hair first as usual with your  normal  Shampoo.   3.  After you shampoo, rinse your hair and body thoroughly to remove the  shampoo.                                         4.  Use CHG as you would any other liquid soap.  You can apply chg directly  to the skin and wash . Gently wash with scrungie or clean wascloth    5.  Apply the CHG Soap to your body ONLY FROM THE NECK DOWN.   Do not use on open                           Wound or open sores. Avoid contact with eyes, ears mouth and genitals (private parts).  Genitals (private parts) with your normal soap.              6.  Wash thoroughly, paying special attention to the area where your surgery  will be performed.   7.  Thoroughly rinse your body with warm water from the neck down.   8.  DO NOT shower/wash with your normal soap after using and rinsing off  the CHG Soap .                9.  Pat yourself dry with a clean towel.             10.  Wear clean night clothes to bed after shower             11.  Place clean sheets on your bed the night of your first shower and do not  sleep with pets.  Day of Surgery : Do not apply any lotions/deodorants the morning of surgery.  Please wear clean clothes to the hospital/surgery center.  FAILURE TO FOLLOW THESE INSTRUCTIONS MAY RESULT IN THE CANCELLATION OF YOUR SURGERY    PATIENT SIGNATURE_________________________________  ______________________________________________________________________

## 2015-11-06 ENCOUNTER — Encounter (HOSPITAL_COMMUNITY): Payer: Self-pay

## 2015-11-06 ENCOUNTER — Encounter (HOSPITAL_COMMUNITY)
Admission: RE | Admit: 2015-11-06 | Discharge: 2015-11-06 | Disposition: A | Discharge status: Home / Self Care | Payer: 59 | Source: Ambulatory Visit | Attending: General Surgery | Admitting: General Surgery

## 2015-11-06 DIAGNOSIS — Z01818 Encounter for other preprocedural examination: Secondary | ICD-10-CM | POA: Diagnosis not present

## 2015-11-06 HISTORY — DX: Asymptomatic varicose veins of unspecified lower extremity: I83.90

## 2015-11-06 HISTORY — DX: Diverticulitis of intestine, part unspecified, without perforation or abscess without bleeding: K57.92

## 2015-11-06 HISTORY — DX: Personal history of other specified conditions: Z87.898

## 2015-11-06 HISTORY — DX: Other allergy status, other than to drugs and biological substances: Z91.09

## 2015-11-06 HISTORY — DX: Colostomy status: Z93.3

## 2015-11-06 HISTORY — DX: Sciatica, unspecified side: M54.30

## 2015-11-06 HISTORY — DX: Gastro-esophageal reflux disease without esophagitis: K21.9

## 2015-11-06 HISTORY — DX: Personal history of other medical treatment: Z92.89

## 2015-11-06 HISTORY — DX: Reaction to severe stress, unspecified: F43.9

## 2015-11-06 LAB — CBC
HCT: 40 % (ref 36.0–46.0)
HEMOGLOBIN: 12.9 g/dL (ref 12.0–15.0)
MCH: 28.2 pg (ref 26.0–34.0)
MCHC: 32.3 g/dL (ref 30.0–36.0)
MCV: 87.5 fL (ref 78.0–100.0)
PLATELETS: 240 10*3/uL (ref 150–400)
RBC: 4.57 MIL/uL (ref 3.87–5.11)
RDW: 12.8 % (ref 11.5–15.5)
WBC: 5.9 10*3/uL (ref 4.0–10.5)

## 2015-11-06 LAB — BASIC METABOLIC PANEL
Anion gap: 8 (ref 5–15)
BUN: 11 mg/dL (ref 6–20)
CHLORIDE: 104 mmol/L (ref 101–111)
CO2: 29 mmol/L (ref 22–32)
Calcium: 9.4 mg/dL (ref 8.9–10.3)
Creatinine, Ser: 0.61 mg/dL (ref 0.44–1.00)
GFR calc Af Amer: >60 mL/min (ref >60)
GFR calc non Af Amer: >60 mL/min (ref >60)
GLUCOSE: 103 mg/dL — AB (ref 65–99)
POTASSIUM: 3.9 mmol/L (ref 3.5–5.1)
Sodium: 141 mmol/L (ref 135–145)

## 2015-11-07 LAB — HEMOGLOBIN A1C
HEMOGLOBIN A1C: 5.4 % (ref 4.8–5.6)
MEAN PLASMA GLUCOSE: 108 mg/dL

## 2015-11-13 ENCOUNTER — Ambulatory Visit (HOSPITAL_COMMUNITY)
Admission: RE | Admit: 2015-11-13 | Discharge: 2015-11-13 | Disposition: A | Discharge status: Home / Self Care | Payer: 59 | Source: Ambulatory Visit | Attending: Surgery | Admitting: Surgery

## 2015-11-13 ENCOUNTER — Encounter (HOSPITAL_COMMUNITY): Payer: Self-pay

## 2015-11-13 ENCOUNTER — Encounter (HOSPITAL_COMMUNITY)
Admission: RE | Disposition: A | Discharge status: Home / Self Care | Payer: Self-pay | Source: Ambulatory Visit | Attending: Surgery

## 2015-11-13 ENCOUNTER — Other Ambulatory Visit: Payer: Self-pay | Admitting: General Surgery

## 2015-11-13 HISTORY — PX: COLONOSCOPY: SHX5424

## 2015-11-13 SURGERY — COLONOSCOPY
Anesthesia: Moderate Sedation

## 2015-11-13 MED ORDER — MIDAZOLAM HCL 5 MG/ML IJ SOLN
INTRAMUSCULAR | Status: AC
  Filled 2015-11-13: qty 2

## 2015-11-13 MED ORDER — MIDAZOLAM HCL 5 MG/ML IJ SOLN
INTRAMUSCULAR | Status: DC | PRN
  Administered 2015-11-13 (×2): 2 mg via INTRAVENOUS
  Administered 2015-11-13: 1 mg via INTRAVENOUS

## 2015-11-13 MED ORDER — HEPARIN SODIUM (PORCINE) 5000 UNIT/ML IJ SOLN: 5000.0000 [IU] | Freq: Once | INTRAMUSCULAR | Status: DC

## 2015-11-13 MED ORDER — DEXTROSE 5 % IV SOLN: 2.0000 g | INTRAVENOUS | Status: DC

## 2015-11-13 MED ORDER — CEFOTETAN DISODIUM-DEXTROSE 2-2.08 GM-% IV SOLR: 2.0000 g | INTRAVENOUS | Status: DC

## 2015-11-13 MED ORDER — SODIUM CHLORIDE 0.9 % IV SOLN
INTRAVENOUS | Status: DC
  Administered 2015-11-13: 500 mL via INTRAVENOUS

## 2015-11-13 MED ORDER — FENTANYL CITRATE (PF) 100 MCG/2ML IJ SOLN
INTRAMUSCULAR | Status: AC
  Filled 2015-11-13: qty 2

## 2015-11-13 MED ORDER — ALVIMOPAN 12 MG PO CAPS
12.0000 mg | ORAL_CAPSULE | Freq: Once | ORAL | Status: DC
  Filled 2015-11-13: qty 1

## 2015-11-13 MED ORDER — SODIUM CHLORIDE 0.9 % IV SOLN: INTRAVENOUS | Status: DC

## 2015-11-13 MED ORDER — FENTANYL CITRATE (PF) 100 MCG/2ML IJ SOLN
INTRAMUSCULAR | Status: DC | PRN
  Administered 2015-11-13 (×2): 25 ug via INTRAVENOUS

## 2015-11-13 NOTE — H&P (Signed)
  70 y.o. F s/p colostomy and resection of diverticular stricture.  She is ready for colostomy reversal.  She is here for completion of her screening colonoscopy.    Past Medical History  Diagnosis Date  . Hypertension   . Hyperlipidemia   . Inguinal hernia unilateral, non-recurrent     right, strangulated-right colon  . Colon polyp   . Environmental allergies   . H/O syncope     x 1 in 2014 - unknown etiology  . Sciatica   . Varicose veins   . Diverticulitis   . Colostomy in place [facility])   . GERD (gastroesophageal reflux disease)     occasional - takes Mylanta as needed  . History of transfusion   . Stress     sees counselor for "stress"   Past Surgical History  Procedure Laterality Date  . Ectopic pregnancy surgery  1974  . Tonsilectomy, adenoidectomy, bilateral myringotomy and tubes  1950  . Hernia repair  2012    right inguinal  . Colon surgery      right colectomy  . Colectomy with colostomy creation/hartmann procedure N/A 08/08/2015    Procedure: Open [place] Procedure, Sigmoid Resection, Colostomy;  Surgeon: Leighton Ruff, MD;  Location: [facility];  Service: General;  Laterality: N/A;  . Flexible sigmoidoscopy N/A 08/07/2015    Procedure: Beryle Quant;  Surgeon: Teena Irani, MD;  Location[facility];  Service: Endoscopy;  Laterality: N/A;    Family History  Problem Relation Age of Onset  . Heart disease Mother   . Cancer Father     melanoma   Social History   Social History  . Marital Status: Divorced    Spouse Name: N/A  . Number of Children: N/A  . Years of Education: N/A   Occupational History  . Not on file.   Social History Main Topics  . Smoking status: Never Smoker   . Smokeless tobacco: Never Used  . Alcohol Use: 0.6 oz/week    1 Glasses of wine per week     Comment: socially  . Drug Use: No  . Sexual Activity: No   Other Topics Concern  . Not on file   Social History Narrative   Review of Systems - General ROS: negative for -  chills or fever Respiratory ROS: no cough, shortness of breath, or wheezing Cardiovascular ROS: no chest pain or dyspnea on exertion Gastrointestinal ROS: no abdominal pain, change in bowel habits, or black or bloody stools  BP 158/80 mmHg  Pulse 85  Temp(Src) 98.1 F (36.7 C) (Oral)  Resp 14  Ht 5\' 5"  (1.651 m)  Wt 54.885 kg (121 lb)  BMI 20.14 kg/m2  SpO2 100%   Physical Exam  Constitutional: She is oriented to person, place, and time and well-developed, well-nourished, and in no distress.  HENT:  Head: Normocephalic and atraumatic.  Eyes: Conjunctivae and EOM are normal. Pupils are equal, round, and reactive to light.  Neck: Normal range of motion. Neck supple.  Cardiovascular: Normal rate and regular rhythm.   Pulmonary/Chest: Effort normal and breath sounds normal.  Abdominal: Soft. Bowel sounds are normal. She exhibits no distension. There is no tenderness.  Musculoskeletal: Normal range of motion.  Neurological: She is alert and oriented to person, place, and time.  Skin: Skin is warm and dry.    Plan: Will proceed with completion colonoscopy.  Risks discussed with patient included bleeding and perforation.  All questions answered.

## 2015-11-13 NOTE — Op Note (Signed)
[facility] [place], [address]   COLONOSCOPY PROCEDURE REPORT  PATIENT: Emily, Little  MR#: [id number] BIRTHDATE: 1945-11-17 , 28  yrs. old GENDER: female ENDOSCOPIST: Rosario Adie, MD REFERRED BY: PROCEDURE DATE:  11/13/2015 PROCEDURE:   Colonoscopy with biopsy ASA CLASS:   Class II INDICATIONS:average risk patient for colorectal cancer. MEDICATIONS: Fentanyl 50 mcg IV and Versed 5 mg IV  DESCRIPTION OF PROCEDURE:   After the risks benefits and alternatives of the procedure were thoroughly explained, informed consent was obtained.  was not performed.   The  endoscope was introduced through the descending colostomy and advanced to the cecum, which was identified by both the appendix and ileocecal valve.  We did not review the rectal stump, since this was cleared with her last attempted colonoscopy.  No adverse events experienced.   The quality of the prep was excellent.  The instrument was then slowly withdrawn as the colon was fully examined. Estimated blood loss is zero unless otherwise noted in this procedure report.    Findings: Cecal polyp   COLON FINDINGS: A polypoid shaped sessile polyp measuring 3 mm in size was found at the cecum.  A polypectomy was performed with cold forceps.  The resection was complete, the polyp tissue was completely retrieved and sent to histology.   There was moderate diverticulosis noted throughout the entire examined colon. Retroflexion was not performed.  Withdrawal time was 12 mins .  The scope was withdrawn and the procedure completed.  COMPLICATIONS: There were no immediate complications.  ENDOSCOPIC IMPRESSION:     1.   Sessile polyp was found at the cecum; polypectomy was performed with cold forceps 2.   There was moderate diverticulosis noted throughout the entire examined colon  RECOMMENDATIONS:     Await pathology results  eSigned:  Rosario Adie, MD 0000000 3:23 PM   cc:  CPT  CODES:     [phone] Colonoscopy, flexible, proximal to splenic flexure; with biopsy, single or multiple ICD CODES:     Z12.11 Encounter for screening for malignant neoplasm of colon  The ICD and CPT codes recommended by this software are interpretations from the data that the clinical staff has captured with the software.  The verification of the translation of this report to the ICD and CPT codes and modifiers is the sole responsibility of the health care institution and practicing physician where this report was generated.  [employer]. will not be held responsible for the validity of the ICD and CPT codes included on this report.  AMA assumes no liability for data contained or not contained herein. CPT is a Designer, television/film set of the [employer].

## 2015-11-13 NOTE — Discharge Instructions (Signed)
Post Colonoscopy Instructions  1. DIET: Follow a light bland diet the first 24 hours after arrival home, such as soup, liquids, crackers, etc.  Be sure to include lots of fluids daily.  Avoid fast food or heavy meals as your are more likely to get nauseated.   2. You may have some mild rectal bleeding for the first few days after the procedure.  This should get less and less with time.  Resume any blood thinners 2 days after your procedure unless directed otherwise by your physician. 3. Take your usually prescribed home medications unless otherwise directed. a. If you have any pain, it is helpful to get up and walk around, as it is usually from excess gas. b. If this is not helpful, you can take an over-the-counter pain medication.  Choose one of the following that works best for you: i. Naproxen (Aleve, etc)  Two 220mg  tabs twice a day ii. Ibuprofen (Advil, etc) Three 200mg  tabs four times a day (every meal & bedtime) iii. If you still have pain after using one of these, please call the office 4. It is normal to not have a bowel movement for 2-3 days after colonoscopy.    5. ACTIVITIES as tolerated:   6. You may resume regular (light) daily activities beginning the next day--such as daily self-care, walking, climbing stairs--gradually increasing activities as tolerated.    WHEN TO CALL us [phone]: 1. Fever over 101.5 F (38.5 C)  2. Severe abdominal or chest pain  3. Large amount of rectal bleeding, passing multiple blood clots  4. Dizziness or shortness of breath 5. Increasing nausea or vomiting   The clinic staff is available to answer your questions during regular business hours (8:30am-5pm).  Please dont hesitate to call and ask to speak to one of our nurses for clinical concerns.   If you have a medical emergency, go to the nearest emergency room or call 911.  A surgeon from [facility] is always on call at the [facility], [address], [place], [place], [place]  [address] ? MAIN: [phone] ? TOLL FREE: [phone] ?  FAX [phone] [email or web address]   Colonoscopy, Care After These instructions give you information on caring for yourself after your procedure. Your doctor may also give you more specific instructions. Call your doctor if you have any problems or questions after your procedure. HOME CARE  Do not drive for 24 hours.  Do not sign important papers or use machinery for 24 hours.  You may shower.  You may go back to your usual activities, but go slower for the first 24 hours.  Take rest breaks often during the first 24 hours.  Walk around or use warm packs on your belly (abdomen) if you have belly cramping or gas.  Drink enough fluids to keep your pee (urine) clear or pale yellow.  Resume your normal diet. Avoid heavy or fried foods.  Avoid drinking alcohol for 24 hours or as told by your doctor.  Only take medicines as told by your doctor. If a tissue sample (biopsy) was taken during the procedure:   Do not take aspirin or blood thinners for 7 days, or as told by your doctor.  Do not drink alcohol for 7 days, or as told by your doctor.  Eat soft foods for the first 24 hours. GET HELP IF: You still have a small amount of blood in your poop (stool) 2-3 days after the  procedure. GET HELP RIGHT AWAY IF:  You have more than a small amount of blood in your poop.  You see clumps of tissue (blood clots) in your poop.  Your belly is puffy (swollen).  You feel sick to your stomach (nauseous) or throw up (vomit).  You have a fever.  You have belly pain that gets worse and medicine does not help. MAKE SURE YOU:  Understand these instructions.  Will watch your condition.  Will get help right away if you are not doing well or get worse.   This information is not intended to replace advice given to you by your health care provider. Make sure you discuss any  questions you have with your health care provider.   Document Released: 11/29/2010 Document Revised: 11/01/2013 Document Reviewed: 07/04/2013 Elsevier Interactive Patient Education [employer].

## 2015-11-14 ENCOUNTER — Inpatient Hospital Stay (HOSPITAL_COMMUNITY): Payer: 59 | Admitting: Anesthesiology

## 2015-11-14 ENCOUNTER — Encounter (HOSPITAL_COMMUNITY): Payer: Self-pay | Admitting: General Surgery

## 2015-11-14 ENCOUNTER — Encounter (HOSPITAL_COMMUNITY)
Admission: RE | Disposition: A | Discharge status: Skilled Nursing Facility | Payer: Self-pay | Source: Ambulatory Visit | Attending: General Surgery

## 2015-11-14 ENCOUNTER — Inpatient Hospital Stay (HOSPITAL_COMMUNITY)
Admission: RE | Admit: 2015-11-14 | Discharge: 2015-11-19 | DRG: 330 | Disposition: A | Discharge status: Skilled Nursing Facility | Payer: 59 | Source: Ambulatory Visit | Attending: General Surgery | Admitting: General Surgery

## 2015-11-14 DIAGNOSIS — I10 Essential (primary) hypertension: Secondary | ICD-10-CM | POA: Diagnosis present

## 2015-11-14 DIAGNOSIS — K5730 Diverticulosis of large intestine without perforation or abscess without bleeding: Secondary | ICD-10-CM | POA: Diagnosis present

## 2015-11-14 DIAGNOSIS — K219 Gastro-esophageal reflux disease without esophagitis: Secondary | ICD-10-CM | POA: Diagnosis present

## 2015-11-14 DIAGNOSIS — Z433 Encounter for attention to colostomy: Secondary | ICD-10-CM | POA: Diagnosis present

## 2015-11-14 DIAGNOSIS — Z808 Family history of malignant neoplasm of other organs or systems: Secondary | ICD-10-CM | POA: Diagnosis not present

## 2015-11-14 DIAGNOSIS — Z86010 Personal history of colonic polyps: Secondary | ICD-10-CM

## 2015-11-14 DIAGNOSIS — D120 Benign neoplasm of cecum: Secondary | ICD-10-CM | POA: Diagnosis present

## 2015-11-14 DIAGNOSIS — Z8249 Family history of ischemic heart disease and other diseases of the circulatory system: Secondary | ICD-10-CM

## 2015-11-14 DIAGNOSIS — K625 Hemorrhage of anus and rectum: Secondary | ICD-10-CM | POA: Diagnosis not present

## 2015-11-14 DIAGNOSIS — K56609 Unspecified intestinal obstruction, unspecified as to partial versus complete obstruction: Secondary | ICD-10-CM | POA: Diagnosis present

## 2015-11-14 DIAGNOSIS — Z01812 Encounter for preprocedural laboratory examination: Secondary | ICD-10-CM

## 2015-11-14 DIAGNOSIS — E785 Hyperlipidemia, unspecified: Secondary | ICD-10-CM | POA: Diagnosis present

## 2015-11-14 DIAGNOSIS — K5790 Diverticulosis of intestine, part unspecified, without perforation or abscess without bleeding: Secondary | ICD-10-CM | POA: Insufficient documentation

## 2015-11-14 HISTORY — PX: COLOSTOMY TAKEDOWN: SHX5258

## 2015-11-14 LAB — TYPE AND SCREEN
ABO/RH(D): O POS
Antibody Screen: NEGATIVE

## 2015-11-14 SURGERY — CLOSURE, COLOSTOMY, LAPAROSCOPIC
Anesthesia: General

## 2015-11-14 MED ORDER — HYDROMORPHONE HCL 1 MG/ML IJ SOLN
INTRAMUSCULAR | Status: AC
  Filled 2015-11-14: qty 1

## 2015-11-14 MED ORDER — MORPHINE SULFATE (PF) 10 MG/ML IV SOLN
INTRAVENOUS | Status: AC
  Filled 2015-11-14: qty 1

## 2015-11-14 MED ORDER — MIDAZOLAM HCL 2 MG/2ML IJ SOLN
INTRAMUSCULAR | Status: AC
  Filled 2015-11-14: qty 2

## 2015-11-14 MED ORDER — METOCLOPRAMIDE HCL 5 MG/ML IJ SOLN: 10.0000 mg | Freq: Once | INTRAMUSCULAR | Status: DC | PRN

## 2015-11-14 MED ORDER — BUPIVACAINE-EPINEPHRINE (PF) 0.25% -1:200000 IJ SOLN
INTRAMUSCULAR | Status: DC | PRN
  Administered 2015-11-14: 10 mL

## 2015-11-14 MED ORDER — METOCLOPRAMIDE HCL 5 MG/ML IJ SOLN
INTRAMUSCULAR | Status: AC
  Filled 2015-11-14: qty 2

## 2015-11-14 MED ORDER — SUGAMMADEX SODIUM 200 MG/2ML IV SOLN
INTRAVENOUS | Status: DC | PRN
  Administered 2015-11-14: 125 mg via INTRAVENOUS

## 2015-11-14 MED ORDER — DIPHENHYDRAMINE HCL 50 MG/ML IJ SOLN: 12.5000 mg | Freq: Four times a day (QID) | INTRAMUSCULAR | Status: DC | PRN

## 2015-11-14 MED ORDER — ALVIMOPAN 12 MG PO CAPS
12.0000 mg | ORAL_CAPSULE | Freq: Two times a day (BID) | ORAL | Status: DC
  Administered 2015-11-15 – 2015-11-17 (×5): 12 mg via ORAL
  Filled 2015-11-14 (×6): qty 1

## 2015-11-14 MED ORDER — DEXTROSE 5 % IV SOLN
2.0000 g | INTRAVENOUS | Status: AC
  Administered 2015-11-14: 2 g via INTRAVENOUS

## 2015-11-14 MED ORDER — CEFOTETAN DISODIUM-DEXTROSE 2-2.08 GM-% IV SOLR
INTRAVENOUS | Status: AC
  Filled 2015-11-14: qty 50

## 2015-11-14 MED ORDER — GLYCOPYRROLATE 0.2 MG/ML IJ SOLN
INTRAMUSCULAR | Status: AC
  Filled 2015-11-14: qty 1

## 2015-11-14 MED ORDER — HYDROMORPHONE HCL 1 MG/ML IJ SOLN
0.5000 mg | INTRAMUSCULAR | Status: DC | PRN
  Administered 2015-11-14 – 2015-11-15 (×4): 1 mg via INTRAVENOUS
  Filled 2015-11-14 (×4): qty 1

## 2015-11-14 MED ORDER — FLUTICASONE PROPIONATE 50 MCG/ACT NA SUSP
2.0000 | Freq: Every day | NASAL | Status: DC | PRN
  Filled 2015-11-14 (×2): qty 16

## 2015-11-14 MED ORDER — DIPHENHYDRAMINE HCL 50 MG/ML IJ SOLN
INTRAMUSCULAR | Status: AC
  Filled 2015-11-14: qty 1

## 2015-11-14 MED ORDER — ACETAMINOPHEN 500 MG PO TABS: 1000.0000 mg | ORAL_TABLET | Freq: Four times a day (QID) | ORAL | Status: DC

## 2015-11-14 MED ORDER — CLONAZEPAM 0.5 MG PO TABS
0.5000 mg | ORAL_TABLET | Freq: Two times a day (BID) | ORAL | Status: DC | PRN
  Administered 2015-11-15: 0.5 mg via ORAL
  Filled 2015-11-14: qty 1

## 2015-11-14 MED ORDER — ONDANSETRON HCL 4 MG/2ML IJ SOLN: 4.0000 mg | Freq: Four times a day (QID) | INTRAMUSCULAR | Status: DC | PRN

## 2015-11-14 MED ORDER — ONDANSETRON HCL 4 MG/2ML IJ SOLN
INTRAMUSCULAR | Status: AC
  Filled 2015-11-14: qty 2

## 2015-11-14 MED ORDER — ACETAMINOPHEN 10 MG/ML IV SOLN
1000.0000 mg | Freq: Once | INTRAVENOUS | Status: AC
  Administered 2015-11-14: 1000 mg via INTRAVENOUS

## 2015-11-14 MED ORDER — PROPOFOL 10 MG/ML IV BOLUS
INTRAVENOUS | Status: AC
  Filled 2015-11-14: qty 20

## 2015-11-14 MED ORDER — 0.9 % SODIUM CHLORIDE (POUR BTL) OPTIME
TOPICAL | Status: DC | PRN
  Administered 2015-11-14: 1000 mL

## 2015-11-14 MED ORDER — METOPROLOL SUCCINATE ER 50 MG PO TB24
50.0000 mg | ORAL_TABLET | Freq: Every day | ORAL | Status: DC
Start: 2015-11-14 — End: 2015-11-19
  Administered 2015-11-15 – 2015-11-19 (×5): 50 mg via ORAL
  Filled 2015-11-14 (×6): qty 1

## 2015-11-14 MED ORDER — DEXAMETHASONE SODIUM PHOSPHATE 10 MG/ML IJ SOLN
INTRAMUSCULAR | Status: DC | PRN
  Administered 2015-11-14: 10 mg via INTRAVENOUS

## 2015-11-14 MED ORDER — LIP MEDEX EX OINT
1.0000 "application " | TOPICAL_OINTMENT | CUTANEOUS | Status: DC | PRN
  Filled 2015-11-14: qty 7

## 2015-11-14 MED ORDER — SUCCINYLCHOLINE CHLORIDE 20 MG/ML IJ SOLN
INTRAMUSCULAR | Status: DC | PRN
  Administered 2015-11-14: 100 mg via INTRAVENOUS

## 2015-11-14 MED ORDER — BUPIVACAINE-EPINEPHRINE (PF) 0.25% -1:200000 IJ SOLN
INTRAMUSCULAR | Status: AC
  Filled 2015-11-14: qty 30

## 2015-11-14 MED ORDER — MIDAZOLAM HCL 2 MG/2ML IJ SOLN
0.2500 mg | INTRAMUSCULAR | Status: DC | PRN
  Administered 2015-11-14 (×3): 0.25 mg via INTRAVENOUS

## 2015-11-14 MED ORDER — METOPROLOL SUCCINATE ER 50 MG PO TB24
50.0000 mg | ORAL_TABLET | Freq: Every day | ORAL | Status: DC
  Administered 2015-11-14: 50 mg via ORAL
  Filled 2015-11-14 (×2): qty 1

## 2015-11-14 MED ORDER — LACTATED RINGERS IR SOLN
Status: DC | PRN
  Administered 2015-11-14: 1000 mL
  Administered 2015-11-14: 2000 mL

## 2015-11-14 MED ORDER — SUGAMMADEX SODIUM 200 MG/2ML IV SOLN
INTRAVENOUS | Status: AC
  Filled 2015-11-14: qty 2

## 2015-11-14 MED ORDER — DEXTROSE 5 % IV SOLN
2.0000 g | Freq: Two times a day (BID) | INTRAVENOUS | Status: AC
  Administered 2015-11-15: 2 g via INTRAVENOUS
  Filled 2015-11-14 (×2): qty 2

## 2015-11-14 MED ORDER — ONDANSETRON HCL 4 MG/2ML IJ SOLN
INTRAMUSCULAR | Status: DC | PRN
  Administered 2015-11-14: 4 mg via INTRAVENOUS

## 2015-11-14 MED ORDER — ATORVASTATIN CALCIUM 10 MG PO TABS
10.0000 mg | ORAL_TABLET | ORAL | Status: DC
  Administered 2015-11-16 – 2015-11-18 (×2): 10 mg via ORAL
  Filled 2015-11-14 (×3): qty 1

## 2015-11-14 MED ORDER — METOCLOPRAMIDE HCL 5 MG/ML IJ SOLN
INTRAMUSCULAR | Status: DC | PRN
  Administered 2015-11-14: 10 mg via INTRAVENOUS

## 2015-11-14 MED ORDER — DIPHENHYDRAMINE HCL 12.5 MG/5ML PO ELIX: 12.5000 mg | ORAL_SOLUTION | Freq: Four times a day (QID) | ORAL | Status: DC | PRN

## 2015-11-14 MED ORDER — ONDANSETRON HCL 4 MG PO TABS: 4.0000 mg | ORAL_TABLET | Freq: Four times a day (QID) | ORAL | Status: DC | PRN

## 2015-11-14 MED ORDER — MELATONIN 10 MG PO TABS: 10.0000 mg | ORAL_TABLET | Freq: Every day | ORAL | Status: DC

## 2015-11-14 MED ORDER — FENTANYL CITRATE (PF) 250 MCG/5ML IJ SOLN
INTRAMUSCULAR | Status: AC
  Filled 2015-11-14: qty 5

## 2015-11-14 MED ORDER — LACTATED RINGERS IV SOLN
INTRAVENOUS | Status: DC | PRN
  Administered 2015-11-14 (×2): via INTRAVENOUS

## 2015-11-14 MED ORDER — ROCURONIUM BROMIDE 100 MG/10ML IV SOLN
INTRAVENOUS | Status: DC | PRN
  Administered 2015-11-14 (×4): 5 mg via INTRAVENOUS
  Administered 2015-11-14: 25 mg via INTRAVENOUS

## 2015-11-14 MED ORDER — EPHEDRINE SULFATE 50 MG/ML IJ SOLN
INTRAMUSCULAR | Status: DC | PRN
  Administered 2015-11-14 (×2): 5 mg via INTRAVENOUS

## 2015-11-14 MED ORDER — STERILE WATER FOR IRRIGATION IR SOLN
Status: DC | PRN
  Administered 2015-11-14: 1000 mL

## 2015-11-14 MED ORDER — METOPROLOL TARTRATE 1 MG/ML IV SOLN
2.0000 mg | Freq: Four times a day (QID) | INTRAVENOUS | Status: DC
Start: 2015-11-14 — End: 2015-11-15
  Administered 2015-11-14 – 2015-11-15 (×2): 2 mg via INTRAVENOUS
  Filled 2015-11-14 (×6): qty 5

## 2015-11-14 MED ORDER — ROCURONIUM BROMIDE 100 MG/10ML IV SOLN
INTRAVENOUS | Status: AC
  Filled 2015-11-14: qty 1

## 2015-11-14 MED ORDER — LIDOCAINE HCL (CARDIAC) 20 MG/ML IV SOLN
INTRAVENOUS | Status: DC | PRN
  Administered 2015-11-14: 50 mg via INTRAVENOUS

## 2015-11-14 MED ORDER — ALVIMOPAN 12 MG PO CAPS
12.0000 mg | ORAL_CAPSULE | Freq: Once | ORAL | Status: AC
  Administered 2015-11-14: 12 mg via ORAL
  Filled 2015-11-14: qty 1

## 2015-11-14 MED ORDER — ACETAMINOPHEN 10 MG/ML IV SOLN
INTRAVENOUS | Status: AC
  Filled 2015-11-14: qty 100

## 2015-11-14 MED ORDER — DEXAMETHASONE SODIUM PHOSPHATE 10 MG/ML IJ SOLN
INTRAMUSCULAR | Status: AC
  Filled 2015-11-14: qty 1

## 2015-11-14 MED ORDER — ENOXAPARIN SODIUM 40 MG/0.4ML ~~LOC~~ SOLN
40.0000 mg | SUBCUTANEOUS | Status: DC
  Administered 2015-11-15 – 2015-11-19 (×5): 40 mg via SUBCUTANEOUS
  Filled 2015-11-14 (×7): qty 0.4

## 2015-11-14 MED ORDER — PROPOFOL 10 MG/ML IV BOLUS
INTRAVENOUS | Status: DC | PRN
  Administered 2015-11-14: 170 mg via INTRAVENOUS

## 2015-11-14 MED ORDER — HEPARIN SODIUM (PORCINE) 5000 UNIT/ML IJ SOLN
5000.0000 [IU] | Freq: Once | INTRAMUSCULAR | Status: AC
  Administered 2015-11-14: 5000 [IU] via SUBCUTANEOUS
  Filled 2015-11-14: qty 1

## 2015-11-14 MED ORDER — FENTANYL CITRATE (PF) 100 MCG/2ML IJ SOLN
INTRAMUSCULAR | Status: DC | PRN
  Administered 2015-11-14: 50 ug via INTRAVENOUS
  Administered 2015-11-14: 100 ug via INTRAVENOUS
  Administered 2015-11-14 (×2): 50 ug via INTRAVENOUS

## 2015-11-14 MED ORDER — KCL IN DEXTROSE-NACL 20-5-0.45 MEQ/L-%-% IV SOLN
INTRAVENOUS | Status: DC
  Administered 2015-11-14 – 2015-11-15 (×2): via INTRAVENOUS
  Filled 2015-11-14 (×4): qty 1000

## 2015-11-14 MED ORDER — CISATRACURIUM BESYLATE 20 MG/10ML IV SOLN
INTRAVENOUS | Status: AC
  Filled 2015-11-14: qty 10

## 2015-11-14 MED ORDER — MORPHINE SULFATE (PF) 10 MG/ML IV SOLN
1.0000 mg | INTRAVENOUS | Status: DC | PRN
  Administered 2015-11-14 (×4): 2 mg via INTRAVENOUS

## 2015-11-14 MED ORDER — HYDROMORPHONE HCL 1 MG/ML IJ SOLN
0.2500 mg | INTRAMUSCULAR | Status: DC | PRN
  Administered 2015-11-14 (×4): 0.5 mg via INTRAVENOUS

## 2015-11-14 MED ORDER — HYDROCHLOROTHIAZIDE 25 MG PO TABS
12.5000 mg | ORAL_TABLET | Freq: Every day | ORAL | Status: DC
  Filled 2015-11-14 (×2): qty 0.5

## 2015-11-14 SURGICAL SUPPLY — 70 items
APPLIER CLIP 5 13 M/L LIGAMAX5 (MISCELLANEOUS) ×3
APPLIER CLIP ROT 10 11.4 M/L (STAPLE)
APR CLP MED LRG 11.4X10 (STAPLE)
APR CLP MED LRG 5 ANG JAW (MISCELLANEOUS) ×1
BLADE EXTENDED COATED 6.5IN (ELECTRODE) ×3 IMPLANT
BLADE HEX COATED 2.75 (ELECTRODE) ×3 IMPLANT
CABLE HIGH FREQUENCY MONO STRZ (ELECTRODE) ×3 IMPLANT
CELLS DAT CNTRL 66122 CELL SVR (MISCELLANEOUS) IMPLANT
CLIP APPLIE 5 13 M/L LIGAMAX5 (MISCELLANEOUS) ×1 IMPLANT
CLIP APPLIE ROT 10 11.4 M/L (STAPLE) IMPLANT
COVER SURGICAL LIGHT HANDLE (MISCELLANEOUS) ×3 IMPLANT
DECANTER SPIKE VIAL GLASS SM (MISCELLANEOUS) ×3 IMPLANT
DRAIN CHANNEL 19F RND (DRAIN) IMPLANT
DRAPE LAPAROSCOPIC ABDOMINAL (DRAPES) ×3 IMPLANT
DRAPE UTILITY XL STRL (DRAPES) ×6 IMPLANT
ELECT PENCIL ROCKER SW 15FT (MISCELLANEOUS) ×6 IMPLANT
ELECT REM PT RETURN 9FT ADLT (ELECTROSURGICAL) ×3
ELECTRODE REM PT RTRN 9FT ADLT (ELECTROSURGICAL) ×1 IMPLANT
EVACUATOR SILICONE 100CC (DRAIN) IMPLANT
GAUZE SPONGE 4X4 12PLY STRL (GAUZE/BANDAGES/DRESSINGS) ×3 IMPLANT
GLOVE BIO SURGEON STRL SZ 6.5 (GLOVE) ×8 IMPLANT
GLOVE BIO SURGEONS STRL SZ 6.5 (GLOVE) ×4
GLOVE BIOGEL PI IND STRL 7.0 (GLOVE) ×2 IMPLANT
GLOVE BIOGEL PI IND STRL 7.5 (GLOVE) IMPLANT
GLOVE BIOGEL PI INDICATOR 7.0 (GLOVE) ×8
GLOVE BIOGEL PI INDICATOR 7.5 (GLOVE) ×6
GOWN STRL REUS W/TWL 2XL LVL3 (GOWN DISPOSABLE) ×6 IMPLANT
GOWN STRL REUS W/TWL XL LVL3 (GOWN DISPOSABLE) ×18 IMPLANT
HANDLE SUCTION POOLE (INSTRUMENTS) ×1 IMPLANT
LEGGING LITHOTOMY PAIR STRL (DRAPES) ×3 IMPLANT
LIGASURE IMPACT 36 18CM CVD LR (INSTRUMENTS) IMPLANT
LIQUID BAND (GAUZE/BANDAGES/DRESSINGS) ×4 IMPLANT
NS IRRIG 1000ML POUR BTL (IV SOLUTION) ×3 IMPLANT
PACK COLON (CUSTOM PROCEDURE TRAY) ×3 IMPLANT
PORT LAP GEL ALEXIS MED 5-9CM (MISCELLANEOUS) IMPLANT
RETRACTOR WND ALEXIS 18 MED (MISCELLANEOUS) IMPLANT
RTRCTR WOUND ALEXIS 18CM MED (MISCELLANEOUS)
SCISSORS LAP 5X35 DISP (ENDOMECHANICALS) ×3 IMPLANT
SEALER TISSUE G2 STRG ARTC 35C (ENDOMECHANICALS) ×3 IMPLANT
SET IRRIG TUBING LAPAROSCOPIC (IRRIGATION / IRRIGATOR) ×6 IMPLANT
SLEEVE XCEL OPT CAN 5 100 (ENDOMECHANICALS) ×3 IMPLANT
SOLUTION ANTI FOG 6CC (MISCELLANEOUS) ×3 IMPLANT
SPONGE LAP 18X18 X RAY DECT (DISPOSABLE) IMPLANT
STAPLER CIRC ILS CVD 33MM 37CM (STAPLE) ×2 IMPLANT
STAPLER VISISTAT 35W (STAPLE) ×3 IMPLANT
SUCTION POOLE HANDLE (INSTRUMENTS) ×3
SUT ETHILON 2 0 PS N (SUTURE) IMPLANT
SUT PDS AB 1 CTX 36 (SUTURE) IMPLANT
SUT PDS AB 1 TP1 96 (SUTURE) IMPLANT
SUT PROLENE 2 0 KS (SUTURE) IMPLANT
SUT PROLENE 2 0 SH DA (SUTURE) ×2 IMPLANT
SUT SILK 2 0 (SUTURE) ×3
SUT SILK 2 0 SH CR/8 (SUTURE) ×3 IMPLANT
SUT SILK 2-0 18XBRD TIE 12 (SUTURE) ×1 IMPLANT
SUT SILK 3 0 (SUTURE) ×3
SUT SILK 3 0 SH CR/8 (SUTURE) ×3 IMPLANT
SUT SILK 3-0 18XBRD TIE 12 (SUTURE) ×1 IMPLANT
SUT VIC AB 2-0 SH 18 (SUTURE) IMPLANT
SUT VICRYL 2 0 18  UND BR (SUTURE) ×2
SUT VICRYL 2 0 18 UND BR (SUTURE) ×1 IMPLANT
SYS LAPSCP GELPORT 120MM (MISCELLANEOUS)
SYSTEM LAPSCP GELPORT 120MM (MISCELLANEOUS) IMPLANT
TAPE CLOTH SURG 6X10 WHT LF (GAUZE/BANDAGES/DRESSINGS) ×3 IMPLANT
TOWEL OR 17X26 10 PK STRL BLUE (TOWEL DISPOSABLE) IMPLANT
TOWEL OR NON WOVEN STRL DISP B (DISPOSABLE) ×6 IMPLANT
TRAY FOLEY W/METER SILVER 14FR (SET/KITS/TRAYS/PACK) ×3 IMPLANT
TRAY FOLEY W/METER SILVER 16FR (SET/KITS/TRAYS/PACK) ×1 IMPLANT
TROCAR BLADELESS OPT 5 100 (ENDOMECHANICALS) ×3 IMPLANT
TROCAR XCEL BLUNT TIP 100MML (ENDOMECHANICALS) ×3 IMPLANT
TUBING FILTER THERMOFLATOR (ELECTROSURGICAL) ×3 IMPLANT

## 2015-11-14 NOTE — H&P (View-Only) (Signed)
  70 y.o. F s/p colostomy and resection of diverticular stricture.  She is ready for colostomy reversal.  She is here for completion of her screening colonoscopy.    Past Medical History  Diagnosis Date  . Hypertension   . Hyperlipidemia   . Inguinal hernia unilateral, non-recurrent     right, strangulated-right colon  . Colon polyp   . Environmental allergies   . H/O syncope     x 1 in 2014 - unknown etiology  . Sciatica   . Varicose veins   . Diverticulitis   . Colostomy in place [facility])   . GERD (gastroesophageal reflux disease)     occasional - takes Mylanta as needed  . History of transfusion   . Stress     sees counselor for "stress"   Past Surgical History  Procedure Laterality Date  . Ectopic pregnancy surgery  1974  . Tonsilectomy, adenoidectomy, bilateral myringotomy and tubes  1950  . Hernia repair  2012    right inguinal  . Colon surgery      right colectomy  . Colectomy with colostomy creation/hartmann procedure N/A 08/08/2015    Procedure: Open [place] Procedure, Sigmoid Resection, Colostomy;  Surgeon: Leighton Ruff, MD;  Location: [facility] ORS;  Service: General;  Laterality: N/A;  . Flexible sigmoidoscopy N/A 08/07/2015    Procedure: Beryle Quant;  Surgeon: Teena Irani, MD;  Location: [facility] ENDOSCOPY;  Service: Endoscopy;  Laterality: N/A;    Family History  Problem Relation Age of Onset  . Heart disease Mother   . Cancer Father     melanoma   Social History   Social History  . Marital Status: Divorced    Spouse Name: N/A  . Number of Children: N/A  . Years of Education: N/A   Occupational History  . Not on file.   Social History Main Topics  . Smoking status: Never Smoker   . Smokeless tobacco: Never Used  . Alcohol Use: 0.6 oz/week    1 Glasses of wine per week     Comment: socially  . Drug Use: No  . Sexual Activity: No   Other Topics Concern  . Not on file   Social History Narrative   Review of Systems - General ROS: negative for -  chills or fever Respiratory ROS: no cough, shortness of breath, or wheezing Cardiovascular ROS: no chest pain or dyspnea on exertion Gastrointestinal ROS: no abdominal pain, change in bowel habits, or black or bloody stools  BP 158/80 mmHg  Pulse 85  Temp(Src) 98.1 F (36.7 C) (Oral)  Resp 14  Ht 5\' 5"  (1.651 m)  Wt 54.885 kg (121 lb)  BMI 20.14 kg/m2  SpO2 100%   Physical Exam  Constitutional: She is oriented to person, place, and time and well-developed, well-nourished, and in no distress.  HENT:  Head: Normocephalic and atraumatic.  Eyes: Conjunctivae and EOM are normal. Pupils are equal, round, and reactive to light.  Neck: Normal range of motion. Neck supple.  Cardiovascular: Normal rate and regular rhythm.   Pulmonary/Chest: Effort normal and breath sounds normal.  Abdominal: Soft. Bowel sounds are normal. She exhibits no distension. There is no tenderness.  Musculoskeletal: Normal range of motion.  Neurological: She is alert and oriented to person, place, and time.  Skin: Skin is warm and dry.    Plan: Will proceed with completion colonoscopy.  Risks discussed with patient included bleeding and perforation.  All questions answered.

## 2015-11-14 NOTE — Interval H&P Note (Signed)
History and Physical Interval Note:  11/14/2015 11:52 AM  [name]  has presented today for surgery, with the diagnosis of colostomy due to diverticular stricture  The various methods of treatment have been discussed with the patient and family. After consideration of risks, benefits and other options for treatment, the patient has consented to  Procedure(s): LAPAROSCOPIC COLOSTOMY REVERSAL (N/A) as a surgical intervention.  The patient's history has been reviewed, patient examined, no change in status, stable for surgery.  I have reviewed the patient's chart and labs.  Questions were answered to the patient's satisfaction.   The surgery and anatomy were described to the patient as well as the risks of surgery and the possible complications.  These include: Bleeding, deep abdominal infections and possible wound complications such as hernia and infection, damage to adjacent structures, leak of surgical connections, which can lead to other surgeries and possibly an ostomy, possible need for other procedures, such as abscess drains in radiology, possible prolonged hospital stay, possible diarrhea from removal of part of the colon, possible constipation from narcotics, prolonged fatigue/weakness or appetite loss, possible early recurrence of of disease, possible complications of their medical problems such as heart disease or arrhythmias or lung problems, death (less than 1%). I believe the patient understands and wishes to proceed with the surgery.    Rosario Adie, MD  [facility]

## 2015-11-14 NOTE — Anesthesia Procedure Notes (Signed)
Procedure Name: Intubation Date/Time: 11/14/2015 12:43 PM Performed by: Iana Buzan, Virgel Gess Pre-anesthesia Checklist: Patient identified, Emergency Drugs available, Suction available, Patient being monitored and Timeout performed Patient Re-evaluated:Patient Re-evaluated prior to inductionOxygen Delivery Method: Circle system utilized Preoxygenation: Pre-oxygenation with 100% oxygen Intubation Type: IV induction Ventilation: Mask ventilation without difficulty Laryngoscope Size: Mac and 4 Grade View: Grade III Tube type: Oral Tube size: 7.5 mm Number of attempts: 2 Airway Equipment and Method: Stylet and Video-laryngoscopy Placement Confirmation: ETT inserted through vocal cords under direct vision,  positive ETCO2,  CO2 detector and breath sounds checked- equal and bilateral Secured at: 22 cm Tube secured with: Tape Dental Injury: Teeth and Oropharynx as per pre-operative assessment  Difficulty Due To: Difficult Airway- due to immobile epiglottis, Difficult Airway- due to anterior larynx, Difficult Airway- due to limited oral opening and Difficult Airway- due to dentition Future Recommendations: Recommend- induction with short-acting agent, and alternative techniques readily available

## 2015-11-14 NOTE — Anesthesia Preprocedure Evaluation (Signed)
Anesthesia Evaluation  Patient identified by MRN, date of birth, ID band Patient awake    Reviewed: Allergy & Precautions, NPO status , Patient's Chart, lab work & pertinent test results  Airway Mallampati: II  TM Distance: >3 FB Neck ROM: Full    Dental no notable dental hx.    Pulmonary neg pulmonary ROS,    Pulmonary exam normal breath sounds clear to auscultation       Cardiovascular Exercise Tolerance: Good hypertension, Pt. on medications and Pt. on home beta blockers Normal cardiovascular exam Rhythm:Regular Rate:Normal     Neuro/Psych Seizures -,   Neuromuscular disease negative psych ROS   GI/Hepatic Neg liver ROS, GERD  ,  Endo/Other  negative endocrine ROS  Renal/GU negative Renal ROS  negative genitourinary   Musculoskeletal negative musculoskeletal ROS (+)   Abdominal   Peds negative pediatric ROS (+)  Hematology negative hematology ROS (+)   Anesthesia Other Findings   Reproductive/Obstetrics negative OB ROS                             Anesthesia Physical Anesthesia Plan  ASA: II  Anesthesia Plan: General   Post-op Pain Management:    Induction: Intravenous  Airway Management Planned: Oral ETT  Additional Equipment:   Intra-op Plan:   Post-operative Plan: Extubation in OR  Informed Consent: I have reviewed the patients History and Physical, chart, labs and discussed the procedure including the risks, benefits and alternatives for the proposed anesthesia with the patient or authorized representative who has indicated his/her understanding and acceptance.   Dental advisory given  Plan Discussed with: CRNA  Anesthesia Plan Comments:         Anesthesia Quick Evaluation

## 2015-11-14 NOTE — Transfer of Care (Signed)
Immediate Anesthesia Transfer of Care Note  Patient: [name]  Procedure(s) Performed: Procedure(s): LAPAROSCOPIC COLOSTOMY REVERSAL (N/A)  Patient Location: PACU  Anesthesia Type:General  Level of Consciousness: awake, alert  and oriented  Airway & Oxygen Therapy: Patient Spontanous Breathing and Patient connected to face mask oxygen  Post-op Assessment: Report given to RN and Post -op Vital signs reviewed and stable  Post vital signs: Reviewed and stable  Last Vitals:  Filed Vitals:   11/14/15 1050  BP: 143/79  Pulse: 87  Temp: 36.6 C  Resp: 16    Complications: No apparent anesthesia complications

## 2015-11-14 NOTE — Anesthesia Postprocedure Evaluation (Signed)
Anesthesia Post Note  Patient: [name]  Procedure(s) Performed: Procedure(s) (LRB): LAPAROSCOPIC COLOSTOMY REVERSAL (N/A)  Patient location during evaluation: PACU Anesthesia Type: General Level of consciousness: awake and alert Pain management: pain level controlled Vital Signs Assessment: post-procedure vital signs reviewed and stable Respiratory status: spontaneous breathing, nonlabored ventilation, respiratory function stable and patient connected to nasal cannula oxygen Cardiovascular status: blood pressure returned to baseline and stable Postop Assessment: no signs of nausea or vomiting Anesthetic complications: no    Last Vitals:  Filed Vitals:   11/14/15 1640 11/14/15 1651  BP:  158/81  Pulse: 97 98  Temp:  36.8 C  Resp: 18 18    Last Pain:  Filed Vitals:   11/14/15 1653  PainSc: 5                  Mackayla Mullins J

## 2015-11-14 NOTE — Op Note (Signed)
11/14/2015  3:11 PM  PATIENT:  [name]  70 y.o. 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  INDICATION:    70 y.o. F with colostomy placement ~3 months ago for severe diverticular stricture.  I recommended segmental resection:  The anatomy & physiology of the digestive tract was discussed.  The pathophysiology was discussed.  Natural history risks without surgery was discussed.   I worked to give an overview of the disease and the frequent need to have multispecialty involvement.  I feel the risks of no intervention will lead to serious problems that outweigh the operative risks; therefore, I recommended a partial colectomy to remove the pathology.  Laparoscopic & open techniques were discussed.   Risks such as bleeding, infection, abscess, leak, reoperation, possible ostomy, hernia, heart attack, death, and other risks were discussed.  I noted a good likelihood this will help address the problem.   Goals of post-operative recovery were discussed as well.    The patient expressed understanding & wished to proceed with surgery.  OR FINDINGS:   Patient had multiple loops of small bowel adherent in the pelvis.  The anastomosis rests 8 cm from the anal verge by rigid  proctoscopy.  DESCRIPTION:   Informed consent was confirmed.  The patient underwent general anaesthesia without difficulty.  The patient was positioned appropriately.  VTE prevention in place.  The patient's abdomen was clipped, prepped, & draped in a sterile fashion.  Surgical timeout confirmed our plan.  The patient was positioned in reverse Trendelenburg.  Abdominal entry was gained using open technique and a Hassan port was placed.  Entry was clean.  I induced carbon dioxide insufflation.  Camera inspection revealed no injury.  Extra ports were carefully placed under direct laparoscopic visualization.  I then freed the omentum from the abdominal wall and her previous incision.  This was also freed off of her colostomy.  I reflected the greater omentum and the upper abdomen the small bowel in the upper abdomen.  I then began to lyse adhesions with laparoscopic scissors to allow for mobilization of the small bowel out of the pelvis.  This took approximately 20-30 minutes.  The bowel was able to be mobilized without any injuries noted.  Once this was free, I identified the rectal stump by the previous Prolene sutures placed.  This was tacked to the anterior abdominal wall.  It was gently freed from surrounding tissues until it was completely mobilized.  We placed a rectal dilator into the anal canal and confirmed patency as well as evaluated for any kinking of the rectum.  Once this was free terminated into the colostomy.  This was taken down using Bovie electrocautery to separate the mucocutaneous junction.  I then freed the surrounding subcutaneous tissues also using electrocautery.  The edge  of the ostomy was transected using Bovie electrocautery.  There was good bleeding noted at the edges of the tissue.  A 2-0 Prolene pursestring suture was placed and a 33 mm EEA anvil was placed into the distal descending colon.  The pursestring was tied tightly around this and this was placed back into the abdomen.   The fascia of the colostomy site was closed using interrupted 0 Novafil sutures.  We then reinsufflated the abdomen and mobilized the remaining descending colon off of the lateral sidewall using the Enseal device.  Once this was free and able to be mobilized into the pelvis, the EEA stapler was inserted into the rectum.  An anastomosis was created without tension.  This showed no leak when insufflated under water.  Both tissue donuts were intact.  The anastomosis rests approximately 8 cm from the anal verge.  I then reinsufflated the abdomen and evaluate the small bowel.  There was no sign of injury or ischemia to the small bowel.  The omentum was then brought down over this.  The abdomen was irrigated with 2 L of warm normal saline.  Hemostasis was good.  The abdomen was desufflated and the ports were removed.  The umbilical incision fascia was closed using 0 Vicryl interrupted sutures.  The colostomy site was brought together with 2, 2-0 Vicryl pursestring sutures.  A Telfa wick was placed in the middle.  The skin was closed using 4-0 Vicryl sutures and Dermabond.  A dressing was placed over the colostomy site.  The patient was then awakened from anesthesia and sent to the post anesthesia care unit in stable condition.  All counts were correct per operating room staff.

## 2015-11-14 NOTE — Progress Notes (Signed)
PHARMACIST - PHYSICIAN ORDER COMMUNICATION  CONCERNING: P&T Medication Policy on Herbal Medications  DESCRIPTION:  This patient's order for:  Melatonin has been noted.  This product(s) is classified as an "herbal" or natural product. Due to a lack of definitive safety studies or FDA approval, nonstandard manufacturing practices, plus the potential risk of unknown drug-drug interactions while on inpatient medications, the Pharmacy and Therapeutics Committee does not permit the use of "herbal" or natural products of this type within [facility].   ACTION TAKEN: The pharmacy department is unable to verify this order at this time and your patient has been informed of this safety policy. Please reevaluate patient's clinical condition at discharge and address if the herbal or natural product(s) should be resumed at that time.  Ralene Bathe, PharmD, BCPS 11/14/2015, 5:36 PM  Pager: [phone]

## 2015-11-15 ENCOUNTER — Encounter (HOSPITAL_COMMUNITY): Payer: Self-pay | Admitting: General Surgery

## 2015-11-15 LAB — BASIC METABOLIC PANEL
ANION GAP: 10 (ref 5–15)
BUN: 8 mg/dL (ref 6–20)
CALCIUM: 8.7 mg/dL — AB (ref 8.9–10.3)
CHLORIDE: 100 mmol/L — AB (ref 101–111)
CO2: 29 mmol/L (ref 22–32)
CREATININE: 0.66 mg/dL (ref 0.44–1.00)
GFR calc non Af Amer: >60 mL/min (ref >60)
Glucose, Bld: 127 mg/dL — ABNORMAL HIGH (ref 65–99)
Potassium: 3.6 mmol/L (ref 3.5–5.1)
SODIUM: 139 mmol/L (ref 135–145)

## 2015-11-15 LAB — CBC
HCT: 33.5 % — ABNORMAL LOW (ref 36.0–46.0)
Hemoglobin: 10.7 g/dL — ABNORMAL LOW (ref 12.0–15.0)
MCH: 27.9 pg (ref 26.0–34.0)
MCHC: 31.9 g/dL (ref 30.0–36.0)
MCV: 87.2 fL (ref 78.0–100.0)
PLATELETS: 215 10*3/uL (ref 150–400)
RBC: 3.84 MIL/uL — AB (ref 3.87–5.11)
RDW: 12.6 % (ref 11.5–15.5)
WBC: 10.2 10*3/uL (ref 4.0–10.5)

## 2015-11-15 LAB — HEMOGLOBIN A1C
HEMOGLOBIN A1C: 5.3 % (ref 4.8–5.6)
MEAN PLASMA GLUCOSE: 105 mg/dL

## 2015-11-15 MED ORDER — IBUPROFEN 200 MG PO TABS
400.0000 mg | ORAL_TABLET | Freq: Four times a day (QID) | ORAL | Status: DC | PRN
  Administered 2015-11-17 – 2015-11-18 (×4): 400 mg via ORAL
  Filled 2015-11-15 (×4): qty 2

## 2015-11-15 MED ORDER — HYDROCHLOROTHIAZIDE 12.5 MG PO CAPS
12.5000 mg | ORAL_CAPSULE | Freq: Every day | ORAL | Status: DC
  Administered 2015-11-15 – 2015-11-19 (×5): 12.5 mg via ORAL
  Filled 2015-11-15 (×5): qty 1

## 2015-11-15 MED ORDER — ACETAMINOPHEN 325 MG PO TABS
650.0000 mg | ORAL_TABLET | Freq: Four times a day (QID) | ORAL | Status: DC
  Administered 2015-11-15 – 2015-11-19 (×16): 650 mg via ORAL
  Filled 2015-11-15 (×22): qty 2

## 2015-11-15 MED ORDER — FENTANYL CITRATE (PF) 100 MCG/2ML IJ SOLN
12.5000 ug | INTRAMUSCULAR | Status: DC | PRN
  Administered 2015-11-15 (×2): 12.5 ug via INTRAVENOUS
  Filled 2015-11-15 (×3): qty 2

## 2015-11-15 NOTE — NC FL2 (Signed)
[place] LEVEL OF CARE SCREENING TOOL     IDENTIFICATION  Patient Name: [name] Birthdate: Jun 30, 1946 Sex: female Admission Date (Current Location): 11/14/2015  [facility] and [place] Number:  Herbalist and Address:  [facility],  [place] [address], [place]      Provider Number: [id number]  Attending Physician Name and Address:  Leighton Ruff, MD  Relative Name and Phone Number:       Current Level of Care: Hospital Recommended Level of Care: [place] Prior Approval Number:    Date Approved/Denied:   PASRR Number: [id number]  Discharge Plan: SNF    Current Diagnoses: Patient Active Problem List   Diagnosis Date Noted  . Colostomy in place [facility]) 11/14/2015  . Vitamin D deficiency 08/19/2015  . Pedal edema 08/19/2015  . Colonic obstruction ([place]) 08/06/2015  . Inguinal hernia with strangulation-right 06/03/2011  . High blood pressure 05/06/2011  . High cholesterol 05/06/2011  . Colon polyp/cancer 05/06/2011  . Wears glasses 05/06/2011  . Seizure ([place]) 05/06/2011    Orientation RESPIRATION BLADDER Height & Weight    Self, Time, Situation, Place  O2 Continent 5\' 5"  (165.1 cm) 121 lbs.  BEHAVIORAL SYMPTOMS/MOOD NEUROLOGICAL BOWEL NUTRITION STATUS  Other (Comment) (no behaviors) Convulsions/Seizures Continent Diet  AMBULATORY STATUS COMMUNICATION OF NEEDS Skin    (min guard) Verbally Surgical wounds                       Personal Care Assistance Level of Assistance  Bathing, Dressing, Feeding Bathing Assistance: Independent Feeding assistance: Independent Dressing Assistance: Independent     Functional Limitations Info  Sight, Hearing, Speech Sight Info: Adequate Hearing Info: Adequate Speech Info: Adequate    SPECIAL CARE FACTORS FREQUENCY                       Contractures Contractures Info: Not present    Additional Factors Info  Code Status Code Status Info: Full  Code             Current Medications (11/15/2015):  This is the current hospital active medication list Current Facility-Administered Medications  Medication Dose Route Frequency Provider Last Rate Last Dose  . acetaminophen (TYLENOL) tablet 650 mg  [id number] mg Oral QID Leighton Ruff, MD   [id number] mg at 11/15/15 1231  . alvimopan (ENTEREG) capsule 12 mg  12 mg Oral BID Leighton Ruff, MD   12 mg at 11/15/15 1001  . atorvastatin (LIPITOR) tablet 10 mg  10 mg Oral QODAY Leighton Ruff, MD   10 mg at 11/14/15 2052  . clonazePAM (KLONOPIN) tablet 0.5 mg  0.5 mg Oral BID PRN Leighton Ruff, MD   0.5 mg at 11/15/15 1231  . dextrose 5 % and 0.45 % NaCl with KCl 20 mEq/L infusion   Intravenous Continuous Leighton Ruff, MD 75 mL/hr at 11/14/15 2104    . diphenhydrAMINE (BENADRYL) 12.5 MG/5ML elixir 12.5 mg  12.5 mg Oral [id number] PRN Leighton Ruff, MD       Or  . diphenhydrAMINE (BENADRYL) injection 12.5 mg  12.5 mg Intravenous [id number] PRN Leighton Ruff, MD      . enoxaparin (LOVENOX) injection 40 mg  40 mg Subcutaneous [id number] Leighton Ruff, MD   40 mg at 11/15/15 0835  . fentaNYL (SUBLIMAZE) injection 12.5 mcg  12.5 mcg Intravenous [id number] PRN Leighton Ruff, MD      . fluticasone (FLONASE) 50 MCG/ACT nasal spray 2 spray  2  spray Each Nare Daily PRN Leighton Ruff, MD      . hydrochlorothiazide (MICROZIDE) capsule 12.5 mg  [id number] mg Oral Daily Leighton Ruff, MD   [id number] mg at 11/15/15 1001  . ibuprofen (ADVIL,MOTRIN) tablet 400-800 mg  400-800 mg Oral [id number] PRN Leighton Ruff, MD      . lip balm (CARMEX) ointment 1 application  1 application Topical PRN Leighton Ruff, MD      . metoprolol (LOPRESSOR) injection 2 mg  2 mg Intravenous Q6H Autumn Messing III, MD   2 mg at 11/15/15 0458  . metoprolol succinate (TOPROL-XL) 24 hr tablet 50 mg  50 mg Oral Daily Leighton Ruff, MD   50 mg at 11/15/15 1001  . ondansetron (ZOFRAN) tablet 4 mg  4 mg Oral [id number] PRN Leighton Ruff, MD       Or  . ondansetron [facility]) injection 4 mg  4 mg Intravenous [id number]  PRN Leighton Ruff, MD         Discharge Medications: Please see discharge summary for a list of discharge medications.  Relevant Imaging Results:  Relevant Lab Results:   Additional Information SS # [id number]  Shyana Kulakowski, Randall An, LCSW

## 2015-11-15 NOTE — Progress Notes (Signed)
1 Day Post-Op lap colostomy reversal Subjective: Feeling sore this am.  No nausea.  Ambulated last night.  Had some rectal bleeding  Objective: Vital signs in last 24 hours: Temp:  [97.7 F (36.5 C)-98.6 F (37 C)] 98.1 F (36.7 C) (01/05 0449) Pulse Rate:  [75-99] 75 (01/05 0449) Resp:  [13-26] 16 (01/05 0449) BP: (136-158)/(69-86) 140/69 mmHg (01/05 0449) SpO2:  [99 %-100 %] 100 % (01/05 0449) Weight:  [54.885 kg (121 lb)-56.7 kg (125 lb)] 56.7 kg (125 lb) (01/04 1651)   Intake/Output from previous day: 01/04 0701 - 01/05 0700 In: 3070 [I.V.:2970; IV Piggyback:100] Out: 1250 [Urine:1150; Blood:100] Intake/Output this shift:   General appearance: alert and cooperative GI: normal findings: soft, non-tender  Incision: no significant drainage, no significant erythema  Lab Results:   Recent Labs  11/15/15 0534  WBC 10.2  HGB 10.7*  HCT 33.5*  PLT 215   BMET  Recent Labs  11/15/15 0534  NA 139  K 3.6  CL 100*  CO2 29  GLUCOSE 127*  BUN 8  CREATININE 0.66  CALCIUM 8.7*   PT/INR No results for input(s): LABPROT, INR in the last 72 hours. ABG No results for input(s): PHART, HCO3 in the last 72 hours.  Invalid input(s): PCO2, PO2  MEDS, Scheduled . alvimopan  12 mg Oral BID  . atorvastatin  10 mg Oral QODAY  . enoxaparin (LOVENOX) injection  40 mg Subcutaneous Q24H  . hydrochlorothiazide  12.5 mg Oral Daily  . metoprolol  2 mg Intravenous Q6H  . metoprolol succinate  50 mg Oral Daily    Studies/Results: No results found.  Assessment: s/p Procedure(s): LAPAROSCOPIC COLOSTOMY REVERSAL Patient Active Problem List   Diagnosis Date Noted  . Colostomy in place [facility]) 11/14/2015  . Vitamin D deficiency 08/19/2015  . Pedal edema 08/19/2015  . Colonic obstruction ([place]) 08/06/2015  . Inguinal hernia with strangulation-right 06/03/2011  . High blood pressure 05/06/2011  . High cholesterol 05/06/2011  . Colon polyp/cancer 05/06/2011  . Wears glasses  05/06/2011  . Seizure ([place]) 05/06/2011    Expected post op course  Plan: Advance diet to clears Cont IVF's  Eval for home health needs PT eval SCD's, Lovenox   LOS: 1 day     .Rosario Adie, [facility], [place]   11/15/2015 7:29 AM

## 2015-11-15 NOTE — Clinical Social Work Note (Signed)
Clinical Social Work Assessment  Patient Details  Name: [name] MRN: [id number] Date of Birth: 1946/02/24  Date of referral:  11/15/15               Reason for consult:  Facility Placement, Discharge Planning                Permission sought to share information with:  Chartered certified accountant granted to share information::  Yes, Verbal Permission Granted  Name::        Agency::     Relationship::     Contact Information:     Housing/Transportation Living arrangements for the past 2 months:  Single Family Home Source of Information:  Patient, Adult Children Patient Interpreter Needed:  None Criminal Activity/Legal Involvement Pertinent to Current Situation/Hospitalization:  No - Comment as needed Significant Relationships:  Adult Children, Siblings Lives with:  Self Do you feel safe going back to the place where you live?  No (Requesting ST placement.) Need for family participation in patient care:  Yes (Comment)  Care giving concerns: Pt is not comfortable returning home, alone, at d/c.   Social Worker assessment / plan:  Pt hospitalized on 11/14/15 for a colostomy reversal. CSW met with pt / son to assist with d/c planning. Pt is requesting ST SNF placement following hospital d/c. PT eval is pending but it is unlikely pt will qualify for SNF placement with insurance coverage. Pt / son are aware of this and are requesting pvt pay placement for approximately 1 week. CSW has initiated SNF search and bed offers are pending.  Employment status:  [employer] information:  Managed Care PT Recommendations:  Not assessed at this time Information / Referral to community resources:  [place]  Patient/Family's Response to care:  Pt feels she needs ST placement at d/c.  Patient/Family's Understanding of and Emotional Response to Diagnosis, Current Treatment, and Prognosis:  Pt / son are aware of pt's medical status. Pt lives alone and would  like to go to a SNF, for a week or less, to recover prior to returning home. Her son supports this plan.   Emotional Assessment Appearance:  Appears stated age Attitude/Demeanor/Rapport:  Other (cooperative) Affect (typically observed):  Calm, Pleasant Orientation:  Oriented to Self, Oriented to Place, Oriented to  Time, Oriented to Situation Alcohol / Substance use:  Alcohol Use Psych involvement (Current and /or in the community):  No (Comment)  Discharge Needs  Concerns to be addressed:  Discharge Planning Concerns Readmission within the last 30 days:  No Current discharge risk:  None Barriers to Discharge:  No Barriers Identified   Luretha Rued, [place] 11/15/2015, 1:09 PM

## 2015-11-15 NOTE — Clinical Social Work Placement (Signed)
   CLINICAL SOCIAL WORK PLACEMENT  NOTE  Date:  11/15/2015  Patient Details  Name: [name] MRN: [id number] Date of Birth: 1946/05/15  Clinical Social Work is seeking post-discharge placement for this patient at the [place] level of care (*CSW will initial, date and re-position this form in  chart as items are completed):  Yes   Patient/family provided with [facility] Work Department's list of facilities offering this level of care within the geographic area requested by the patient (or if unable, by the patient's family).  Yes   Patient/family informed of their freedom to choose among providers that offer the needed level of care, that participate in Medicare, Medicaid or managed care program needed by the patient, have an available bed and are willing to accept the patient.  Yes   Patient/family informed of [facility]'s ownership interest in [facility] and [facility], as well as of the fact that they are under no obligation to receive care at these facilities.  PASRR submitted to EDS on       PASRR number received on       Existing PASRR number confirmed on 11/15/15     FL2 transmitted to all facilities in geographic area requested by pt/family on 11/15/15     FL2 transmitted to all facilities within larger geographic area on       Patient informed that his/her managed care company has contracts with or will negotiate with certain facilities, including the following:            Patient/family informed of bed offers received.  Patient chooses bed at       Physician recommends and patient chooses bed at      Patient to be transferred to   on  .  Patient to be transferred to facility by       Patient family notified on   of transfer.  Name of family member notified:        PHYSICIAN       Additional Comment:    _______________________________________________ Luretha Rued, [place]  11/15/2015, 1:17  PM

## 2015-11-15 NOTE — Evaluation (Signed)
Physical Therapy Evaluation Patient Details Name: [name] MRN: [id number] DOB: 1946/08/31 Today's Date: 11/15/2015   History of Present Illness  70 yo female  admitted 11/14/15 for colostomy reversal  Clinical Impression  Patient is  Weak and unsteady today, reports felt more steady on an earlier walk. Patient will benefit from PT to address problems listed in the note below.     Follow Up Recommendations SNF;Supervision/Assistance - 24 hour    Equipment Recommendations  None recommended by PT    Recommendations for Other Services       Precautions / Restrictions Precautions Precautions: Fall      Mobility  Bed Mobility Overal bed mobility: Needs Assistance Bed Mobility: Sit to Sidelying         Sit to sidelying: Mod assist General bed mobility comments: cues on technique, assist for legs onto the bed.  Transfers Overall transfer level: Needs assistance Equipment used: 1 person hand held assist Transfers: Sit to/from Stand Sit to Stand: Mod assist         General transfer comment: power up assist to stand, cues for hand palcement  Ambulation/Gait Ambulation/Gait assistance: Min assist;Mod assist Ambulation Distance (Feet): 400 Feet Assistive device: 1 person hand held assist       General Gait Details: pushed IV pole and required 1 HHA for stability, stop/rest breaks and stretching to stand more erect.  Stairs            Wheelchair Mobility    Modified Rankin (Stroke Patients Only)       Balance Overall balance assessment: Needs assistance         Standing balance support: During functional activity;Single extremity supported Standing balance-Leahy Scale: Poor Standing balance comment: needs UE support                             Pertinent Vitals/Pain Pain Assessment: 0-10 Pain Score: 5  Pain Descriptors / Indicators: Tightness Pain Intervention(s): Limited activity within patient's tolerance;Monitored during  session;Patient requesting pain meds-RN notified;Heat applied    Home Living Family/patient expects to be discharged to:: Private residence Living Arrangements: Alone   Type of Home: Apartment Home Access: Stairs to enter Entrance Stairs-Rails: Psychiatric nurse of Steps: 2 flights Home Layout: One level Home Equipment: None      Prior Function Level of Independence: Independent               Hand Dominance        Extremity/Trunk Assessment   Upper Extremity Assessment: Generalized weakness           Lower Extremity Assessment: Generalized weakness      Cervical / Trunk Assessment: Other exceptions  Communication   Communication: No difficulties  Cognition Arousal/Alertness: Awake/alert Behavior During Therapy: WFL for tasks assessed/performed                   General Comments: patient  was perseverating on another person talking to her about "step down"    General Comments      Exercises        Assessment/Plan    PT Assessment Patient needs continued PT services  PT Diagnosis Generalized weakness;Acute pain   PT Problem List Decreased strength;Decreased activity tolerance;Decreased range of motion;Decreased balance;Decreased mobility  PT Treatment Interventions DME instruction;Gait training;Stair training;Functional mobility training;Therapeutic activities;Therapeutic exercise;Patient/family education   PT Goals (Current goals can be found in the Care Plan section) Acute Rehab PT Goals Patient Stated  Goal: to go to rehab like I did before PT Goal Formulation: With patient/family Time For Goal Achievement: 11/29/15 Potential to Achieve Goals: Good    Frequency Min 3X/week   Barriers to discharge Decreased caregiver support;Inaccessible home environment      Co-evaluation               End of Session   Activity Tolerance: Patient tolerated treatment well Patient left: in bed;with call bell/phone within  reach;with family/visitor present;with nursing/sitter in room Nurse Communication: Mobility status         Time: [id number] PT Time Calculation (min) (ACUTE ONLY): 25 min   Charges:   PT Evaluation $PT Eval Low Complexity: 1 Procedure PT Treatments $Gait Training: 8-22 mins   PT G Codes:        Claretha Cooper 11/15/2015, 4:09 PM  Tresa Endo PT [phone]

## 2015-11-15 NOTE — Progress Notes (Signed)
Discuss d/c plans with patient and son. Patient concerned that she lives alone and does not have assistance with ADL's. Patient was at [facility] after d/c when colostomy was placed. Discussed she would only qualify for SNF this time if PT evaluation indicated it was needed, she has some skilled need for therapy. Patient and son understand, they are interested in private pay at [facility]. If that is not an option they will d/c to home with personal care services, they have used [facility] in the past. Awaiting PT evaluation to determine needs. Discussed with CSW and she will f/u with patient.

## 2015-11-16 LAB — BASIC METABOLIC PANEL
ANION GAP: 5 (ref 5–15)
BUN: <5 mg/dL — ABNORMAL LOW (ref 6–20)
CALCIUM: 8.7 mg/dL — AB (ref 8.9–10.3)
CO2: 29 mmol/L (ref 22–32)
CREATININE: 0.55 mg/dL (ref 0.44–1.00)
Chloride: 108 mmol/L (ref 101–111)
GLUCOSE: 103 mg/dL — AB (ref 65–99)
Potassium: 3.5 mmol/L (ref 3.5–5.1)
Sodium: 142 mmol/L (ref 135–145)

## 2015-11-16 LAB — CBC
HCT: 32 % — ABNORMAL LOW (ref 36.0–46.0)
HEMOGLOBIN: 10.3 g/dL — AB (ref 12.0–15.0)
MCH: 28.7 pg (ref 26.0–34.0)
MCHC: 32.2 g/dL (ref 30.0–36.0)
MCV: 89.1 fL (ref 78.0–100.0)
Platelets: 189 10*3/uL (ref 150–400)
RBC: 3.59 MIL/uL — AB (ref 3.87–5.11)
RDW: 13.1 % (ref 11.5–15.5)
WBC: 6.3 10*3/uL (ref 4.0–10.5)

## 2015-11-16 NOTE — Progress Notes (Signed)
CSW assisting with d/c planning. Pt has a ST Rehab bed at [facility] at d/c. CSW will continue to follow to assist with d/c planning needs.  Werner Lean LCSW [phone]

## 2015-11-16 NOTE — Progress Notes (Signed)
2 Days Post-Op lap colostomy reversal Subjective: Feeling sore this am.  Does not want to take narcotics.  No nausea, some belching.  Ambulated with PT.  Still having some rectal bleeding but this is slowing down  Objective: Vital signs in last 24 hours: Temp:  [97.9 F (36.6 C)-98.8 F (37.1 C)] 97.9 F (36.6 C) (01/06 0605) Pulse Rate:  [56-86] 76 (01/06 0605) Resp:  [15-18] 18 (01/06 0605) BP: (111-138)/(54-68) 122/62 mmHg (01/06 0605) SpO2:  [96 %-99 %] 98 % (01/06 0605)   Intake/Output from previous day: 01/05 0701 - 01/06 0700 In: 2235 [P.O.:360; I.V.:1875] Out: 2100 [Urine:2100] Intake/Output this shift:   General appearance: alert and cooperative GI: normal findings: soft, non-tender  Incision:  no significant erythema  Lab Results:   Recent Labs  11/15/15 0534 11/16/15 0500  WBC 10.2 6.3  HGB 10.7* 10.3*  HCT 33.5* 32.0*  PLT 215 189   BMET  Recent Labs  11/15/15 0534 11/16/15 0500  NA 139 142  K 3.6 3.5  CL 100* 108  CO2 29 29  GLUCOSE 127* 103*  BUN 8 <5*  CREATININE 0.66 0.55  CALCIUM 8.7* 8.7*   PT/INR No results for input(s): LABPROT, INR in the last 72 hours. ABG No results for input(s): PHART, HCO3 in the last 72 hours.  Invalid input(s): PCO2, PO2  MEDS, Scheduled . acetaminophen  650 mg Oral QID  . alvimopan  12 mg Oral BID  . atorvastatin  10 mg Oral QODAY  . enoxaparin (LOVENOX) injection  40 mg Subcutaneous Q24H  . hydrochlorothiazide  12.5 mg Oral Daily  . metoprolol succinate  50 mg Oral Daily    Studies/Results: No results found.  Assessment: s/p Procedure(s): LAPAROSCOPIC COLOSTOMY REVERSAL Patient Active Problem List   Diagnosis Date Noted  . Colostomy in place [facility]) 11/14/2015  . Vitamin D deficiency 08/19/2015  . Pedal edema 08/19/2015  . Colonic obstruction ([place]) 08/06/2015  . Inguinal hernia with strangulation-right 06/03/2011  . High blood pressure 05/06/2011  . High cholesterol 05/06/2011  . Colon  polyp/cancer 05/06/2011  . Wears glasses 05/06/2011  . Seizure ([place]) 05/06/2011    Expected post op course  Plan: Advance diet to full liquids as tolerated Cont IVF's, will minimize rate since UOP excellent Currently, she appears to need a SNF, will plan for this but re-evaluate prior to d/c Cont ambulation, SCD's, Lovenox   LOS: 2 days     .Rosario Adie, [facility], [place]   11/16/2015 8:23 AM

## 2015-11-16 NOTE — Progress Notes (Signed)
Physical Therapy Treatment Patient Details Name: [name] MRN: [id number] DOB: 04/19/1946 Today's Date: 11/18/2015    History of Present Illness 70 yo female  admitted 11/14/15 for colostomy reversal    PT Comments    Pt progressing well; pt is motivated to work with PT, has a little more abdominal soreness today; will follow  Follow Up Recommendations  SNF;Supervision/Assistance - 24 hour     Equipment Recommendations  None recommended by PT    Recommendations for Other Services       Precautions / Restrictions Precautions Precautions: Fall Restrictions Weight Bearing Restrictions: No    Mobility  Bed Mobility               General bed mobility comments: pt OOB with NT   Transfers Overall transfer level: Needs assistance Equipment used: None Transfers: Sit to/from Stand Sit to Stand: Supervision         General transfer comment: for safety  Ambulation/Gait Ambulation/Gait assistance: Min guard Ambulation Distance (Feet): 400 Feet Assistive device: None;1 person hand held assist Gait Pattern/deviations: Step-through pattern;Trunk flexed;Narrow base of support Gait velocity: decr   General Gait Details: decr gait speed, pt is guarded but able to amb without support of IV pole today, min/guard for safety/balance   Stairs            Wheelchair Mobility    Modified Rankin (Stroke Patients Only)       Balance             Standing balance-Leahy Scale: Fair Standing balance comment: able to maintain static stand without UE support                    Cognition Arousal/Alertness: Awake/alert Behavior During Therapy: WFL for tasks assessed/performed Overall Cognitive Status: Within Functional Limits for tasks assessed                      Exercises General Exercises - Lower Extremity Ankle Circles/Pumps: AROM;Both;10 reps Quad Sets: Strengthening;Both;10 reps Long Arc Quad: AROM;Strengthening;Both;10 reps Heel  Raises: Strengthening;10 reps;Both;Standing    General Comments        Pertinent Vitals/Pain Pain Assessment: 0-10 Pain Score: 2  Pain Location: abd Pain Descriptors / Indicators: Discomfort;Sore Pain Intervention(s): Limited activity within patient's tolerance;Monitored during session;Premedicated before session;Repositioned;Heat applied    Home Living                      Prior Function            PT Goals (current goals can now be found in the care plan section) Acute Rehab PT Goals Patient Stated Goal: to go to rehab like I did before PT Goal Formulation: With patient/family Time For Goal Achievement: 11/29/15 Potential to Achieve Goals: Good Progress towards PT goals: Progressing toward goals    Frequency  Min 3X/week    PT Plan Current plan remains appropriate    Co-evaluation             End of Session   Activity Tolerance: Patient tolerated treatment well Patient left: in chair;with call bell/phone within reach;with family/visitor present     Time: [id number] PT Time Calculation (min) (ACUTE ONLY): 20 min  Charges:  $Gait Training: 8-22 mins                    G Codes:      Charod Slawinski 18-Nov-2015, 11:25 AM

## 2015-11-16 NOTE — Progress Notes (Signed)
Foley removed at 06:45 am on 11/16/15, Pt due to void by this afternoon. Will f/u with dayshift Nurse.

## 2015-11-17 LAB — CBC
HCT: 34.1 % — ABNORMAL LOW (ref 36.0–46.0)
HEMOGLOBIN: 10.8 g/dL — AB (ref 12.0–15.0)
MCH: 28.2 pg (ref 26.0–34.0)
MCHC: 31.7 g/dL (ref 30.0–36.0)
MCV: 89 fL (ref 78.0–100.0)
Platelets: 220 10*3/uL (ref 150–400)
RBC: 3.83 MIL/uL — AB (ref 3.87–5.11)
RDW: 12.8 % (ref 11.5–15.5)
WBC: 6.2 10*3/uL (ref 4.0–10.5)

## 2015-11-17 LAB — BASIC METABOLIC PANEL
ANION GAP: 9 (ref 5–15)
BUN: 5 mg/dL — ABNORMAL LOW (ref 6–20)
CALCIUM: 8.5 mg/dL — AB (ref 8.9–10.3)
CHLORIDE: 104 mmol/L (ref 101–111)
CO2: 27 mmol/L (ref 22–32)
CREATININE: 0.47 mg/dL (ref 0.44–1.00)
GFR calc Af Amer: >60 mL/min (ref >60)
GFR calc non Af Amer: >60 mL/min (ref >60)
GLUCOSE: 101 mg/dL — AB (ref 65–99)
Potassium: 3.3 mmol/L — ABNORMAL LOW (ref 3.5–5.1)
Sodium: 140 mmol/L (ref 135–145)

## 2015-11-17 MED ORDER — SODIUM CHLORIDE 0.9 % IJ SOLN: 3.0000 mL | INTRAMUSCULAR | Status: DC | PRN

## 2015-11-17 NOTE — Progress Notes (Signed)
Pharmacy Brief Note - Alvimopan (Entereg)  The standing order set for alvimopan (Entereg) now includes an automatic order to discontinue the drug after the patient has had a bowel movement. The change was approved by the [place] and the Medical Executive Committee.  This patient has had a bowel movement documented by nursing. Therefore, alvimopan has been discontinued. If there are questions, please contact the pharmacy at [phone].  Thank you  Reuel Boom, PharmD, BCPS Pager: [phone] 11/17/2015, 5:33 PM

## 2015-11-17 NOTE — Progress Notes (Signed)
General Surgery Note  LOS: 3 days  POD -  3 Days Post-Op  Assessment/Plan: 1.  LAPAROSCOPIC COLOSTOMY REVERSAL - 11/14/2015 - A. Thomas  Sore but doing well.  Had BM  On full liquids - will advance diet  Probably SNF at discharge   2.  DVT prophylaxis - Lovenox 3.  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  Had BM.  In room by self. Objective:   Filed Vitals:   11/16/15 2155 11/17/15 0634  BP: 136/72 150/86  Pulse: 73 77  Temp: 98.1 F (36.7 C) 98.3 F (36.8 C)  Resp: 18 18     Intake/Output from previous day:  01/06 0701 - 01/07 0700 In: 142.6 [I.V.:142.6] Out: 2100 [Urine:2100]  Intake/Output this shift:      Physical Exam:   General: WN older WF who is alert and oriented.    HEENT: Normal. Pupils equal. .   Lungs: Clear   Abdomen: Soft with BS.   Wound: Clean, though some redness around the ostomy site     Lab Results:    Recent Labs  11/16/15 0500 11/17/15 0550  WBC 6.3 6.2  HGB 10.3* 10.8*  HCT 32.0* 34.1*  PLT 189 220    BMET   Recent Labs  11/16/15 0500 11/17/15 0550  NA 142 140  K 3.5 3.3*  CL 108 104  CO2 29 27  GLUCOSE 103* 101*  BUN <5* 5*  CREATININE 0.55 0.47  CALCIUM 8.7* 8.5*    PT/INR  No results for input(s): LABPROT, INR in the last 72 hours.  ABG  No results for input(s): PHART, HCO3 in the last 72 hours.  Invalid input(s): PCO2, PO2   Studies/Results:  No results found.   Anti-infectives:   Anti-infectives    Start     Dose/Rate Route Frequency Ordered Stop   11/14/15 2200  cefoTEtan (CEFOTAN) 2 g in dextrose 5 % 50 mL IVPB     2 g 100 mL/hr over 30 Minutes Intravenous Every 12 hours 11/14/15 1519 11/15/15 0101   11/14/15 1100  cefoTEtan (CEFOTAN) 2 g in dextrose 5 % 50 mL IVPB     2 g 100 mL/hr over 30 Minutes Intravenous On call to  O.R. 11/14/15 1050 11/14/15 1255      Alphonsa Overall, MD, FACS Pager: [facility] Office: [phone] 11/17/2015

## 2015-11-18 NOTE — Clinical Social Work Note (Addendum)
CSW spoke with patient and [facility] admissions.  [facility] does not have a private room available, which is what patient requested, however [facility] does.  CSW informed patient she agreed to go to [facility], [place] contacted [facility], and they are not able to accept patient until tomorrow due to not being able to order meds for patient from pharmacy, [place] paged physician to inform him, waiting for call back.  CSW to continue to follow patient's progress, SNF can take patient tomorrow once she is medically ready for discharge and orders have been received.  Jones Broom. [place], MSW, [place] 11/18/2015 11:54 AM

## 2015-11-18 NOTE — Progress Notes (Signed)
General Surgery Note  LOS: 4 days  POD -  4 Days Post-Op  Assessment/Plan: 1.  LAPAROSCOPIC COLOSTOMY REVERSAL - 11/14/2015 - A. [place] Capri better  To SNF at discharge - social worker did not see patient yesterday - I need their input.  The word I have is she may go to [facility].  She wants to go home tomorrow.    2.  DVT prophylaxis - Lovenox 3.  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  Doing well.  In room by self. Objective:   Filed Vitals:   11/17/15 2225 11/18/15 0640  BP: 142/71 150/84  Pulse: 74 79  Temp: 98 F (36.7 C) 98.6 F (37 C)  Resp: 18 18     Intake/Output from previous day:  01/07 0701 - 01/08 0700 In: 240 [P.O.:240] Out: -   Intake/Output this shift:      Physical Exam:   General: WN older WF who is alert and oriented.    HEENT: Normal. Pupils equal. .   Lungs: Clear   Abdomen: Soft with BS.   Wound: Clean.  Less redness around ostomy site.     Lab Results:     Recent Labs  11/16/15 0500 11/17/15 0550  WBC 6.3 6.2  HGB 10.3* 10.8*  HCT 32.0* 34.1*  PLT 189 220    BMET    Recent Labs  11/16/15 0500 11/17/15 0550  NA 142 140  K 3.5 3.3*  CL 108 104  CO2 29 27  GLUCOSE 103* 101*  BUN <5* 5*  CREATININE 0.55 0.47  CALCIUM 8.7* 8.5*    PT/INR  No results for input(s): LABPROT, INR in the last 72 hours.  ABG  No results for input(s): PHART, HCO3 in the last 72 hours.  Invalid input(s): PCO2, PO2   Studies/Results:  No results found.   Anti-infectives:   Anti-infectives    Start     Dose/Rate Route Frequency Ordered Stop   11/14/15 2200  cefoTEtan (CEFOTAN) 2 g in dextrose 5 % 50 mL IVPB     2 g 100 mL/hr over 30 Minutes Intravenous Every 12 hours 11/14/15 1519 11/15/15 0101   11/14/15 1100  cefoTEtan (CEFOTAN) 2 g in dextrose 5 % 50 mL IVPB     2  g 100 mL/hr over 30 Minutes Intravenous On call to O.R. 11/14/15 1050 11/14/15 1255      Alphonsa Overall, MD, FACS Pager: [place] [facility] Office: [phone] 11/18/2015

## 2015-11-19 MED ORDER — IBUPROFEN 400 MG PO TABS: 400.0000 mg | ORAL_TABLET | Freq: Four times a day (QID) | ORAL | Status: DC | PRN

## 2015-11-19 NOTE — Clinical Social Work Placement (Signed)
   CLINICAL SOCIAL WORK PLACEMENT  NOTE  Date:  11/19/2015  Patient Details  Name: [name] MRN: [id number] Date of Birth: 02-22-1946  Clinical Social Work is seeking post-discharge placement for this patient at the [place] level of care (*CSW will initial, date and re-position this form in  chart as items are completed):  Yes   Patient/family provided with [facility] Work Department's list of facilities offering this level of care within the geographic area requested by the patient (or if unable, by the patient's family).  Yes   Patient/family informed of their freedom to choose among providers that offer the needed level of care, that participate in Medicare, Medicaid or managed care program needed by the patient, have an available bed and are willing to accept the patient.  Yes   Patient/family informed of [place]'s ownership interest in [facility] and [facility], as well as of the fact that they are under no obligation to receive care at these facilities.  PASRR submitted to EDS on       PASRR number received on       Existing PASRR number confirmed on 11/15/15     FL2 transmitted to all facilities in geographic area requested by pt/family on 11/15/15     FL2 transmitted to all facilities within larger geographic area on       Patient informed that his/her managed care company has contracts with or will negotiate with certain facilities, including the following:        Yes   Patient/family informed of bed offers received.  Patient chooses bed at [facility]     Physician recommends and patient chooses bed at      Patient to be transferred to [facility] on 11/19/15.  Patient to be transferred to facility by [facility] transportation     Patient family notified on 11/19/15 of transfer.  Name of family member notified:  Pt contacted family directly.     PHYSICIAN       Additional  Comment: Pt is in agreement with d/c to [facility] today. Pt requested [facility] transportation to facility. CSW assisted with transportation arrangements. Pt paid for transport with credit card. NSG reviewed d/c summary, avs. D/C Summary sent to SNF for review prior to d/c.    _______________________________________________ Luretha Rued, [place] 11/19/2015, 1:34 PM

## 2015-11-19 NOTE — Progress Notes (Signed)
Physical Therapy Treatment Patient Details Name: [name] MRN: [id number] DOB: [birth year] Today's Date: 11/19/2015    History of Present Illness 70 yo female  admitted 11/14/15 for colostomy reversal    PT Comments    Pt feeling "bad" today.  C/o increased ABD pain and limited activity tolerance.  Pt amb to and from bathroom holding to furniture with poor forward flex posture ABD guarding.  Assisted with amb a limited distance then performed a BERG balance test which pt scored 34/56 indicating HIGH FALL RISK.  Limited due to recent ABD surgery and pain level. Pt will need ST Rehab at SNF prior to safely returning to home alone.  HIGH FALL RISK  Follow Up Recommendations  SNF     Equipment Recommendations  None recommended by PT    Recommendations for Other Services       Precautions / Restrictions Precautions Precautions: Fall Restrictions Weight Bearing Restrictions: No    Mobility  Bed Mobility Overal bed mobility: Needs Assistance Bed Mobility: Supine to Sit;Sit to Supine     Supine to sit: Min assist;Mod assist Sit to supine: Min assist;Mod assist   General bed mobility comments: requires assist and increased time due to ABD pain (recent surgery)  Transfers Overall transfer level: Needs assistance Equipment used: None Transfers: Sit to/from Stand Sit to Stand: Supervision;Min guard         General transfer comment: requires increased time and use of B UE's due to ABD pain (recent surgery)  Ambulation/Gait Ambulation/Gait assistance: Supervision;Min guard Ambulation Distance (Feet): 75 Feet Assistive device: 1 person hand held assist   Gait velocity: decreased   General Gait Details: decreased amb distance due to increased c/o weakness/fatigue/ABD pain.  Unsteady.  Required hand held assist esp with turns and occassional hold to rails/furniture to steady self. poor forward flex posture due to ABD guarding/pain.   Stairs            Wheelchair  Mobility    Modified Rankin (Stroke Patients Only)       Balance     Sitting balance-Leahy Scale: Fair Sitting balance - Comments: requires extra support due to ABD pain level     Standing balance-Leahy Scale: Fair Standing balance comment: static standing is fair with slight forward flex posture due to ABD surgery.  Dynamic standing is poor.  BERG balance test performed.    scored only a 34/56 Indicating HIGH FALL RISK  Plus with recent ABD surgery, demonstrates many limited activities.  Unsteady dynamic balance.                Cognition Arousal/Alertness: Awake/alert Behavior During Therapy: WFL for tasks assessed/performed Overall Cognitive Status: Within Functional Limits for tasks assessed                      Exercises      General Comments General comments (skin integrity, edema, etc.): activity and balance limited by recent ABD surgery and pain level.  HIGH FALL RISK.      Pertinent Vitals/Pain Pain Assessment: 0-10 Pain Score: 8  Pain Location: ABD during activity Pain Descriptors / Indicators: Sore;Tender;Discomfort Pain Intervention(s): Monitored during session;Repositioned    Home Living                      Prior Function            PT Goals (current goals can now be found in the care plan section) Progress towards PT goals: Progressing  toward goals    Frequency  Min 3X/week    PT Plan Current plan remains appropriate    Co-evaluation             End of Session Equipment Utilized During Treatment: Gait belt Activity Tolerance: Patient limited by fatigue;No increased pain       Time: 1025-1040 PT Time Calculation (min) (ACUTE ONLY): 15 min  Charges:  $Gait Training: 8-22 mins                    G Codes:      Rica Koyanagi  PTA [facility]  Acute  Rehab Pager      [phone]

## 2015-11-19 NOTE — Discharge Summary (Addendum)
Physician Discharge Summary  Patient ID: [name] MRN: [id number] DOB/AGE: 1946/10/07 70 y.o.  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  Her diet was advanced as tolerated.  She attempted to use narcotics to control pain, but these caused her to feel unsteady and nauseated.  She was switched to ibuprofen and tylenol.  This worked better for her.  She was evaluated by PT, who felt that she would need some rehab after surgery, since she has no one at home to help her.  She was ready for discharge on POD 4 but a facility was not available for her.  She was discharged the following day.  Consults: None  Significant Diagnostic Studies: labs: CBC, Chemistry  Treatments: IV hydration, analgesia: acetaminophen and surgery: lap colostomy reversal  Discharge Exam: Blood pressure 144/88, pulse 76, temperature 98.1 F (36.7 C), temperature source Oral, resp. rate 16, height 5\' 5"  (1.651 m), weight 56.7 kg (125 lb), SpO2 98 %. General appearance: alert and cooperative Incision/Wound: clean, dry  Disposition: SNF     Medication List    STOP taking these medications        clonazePAM 1 MG tablet  Commonly known as:  KLONOPIN      TAKE these medications        acetaminophen 325 MG tablet  Commonly known as:  TYLENOL  Take 2 tablets (650 mg total) by mouth every 6 (six) hours as needed.     aspirin 81 MG tablet  Take 81 mg by mouth daily.     atorvastatin 10 MG tablet  Commonly known as:  LIPITOR  Take 10 mg by mouth every other day.     cholecalciferol 1000 units tablet   Commonly known as:  VITAMIN D  Take 1,000 Units by mouth daily.     fluticasone 50 MCG/ACT nasal spray  Commonly known as:  FLONASE  Place 2 sprays into both nostrils daily as needed for allergies.     hydrochlorothiazide 25 MG tablet  Commonly known as:  HYDRODIURIL  Take 12.5 mg by mouth daily.     ibuprofen 400 MG tablet  Commonly known as:  ADVIL,MOTRIN  Take 1-2 tablets (400-800 mg total) by mouth every 6 (six) hours as needed for fever or headache.     Melatonin 10 MG Tabs  Take 10 mg by mouth at bedtime.     metoprolol succinate 50 MG 24 hr tablet  Commonly known as:  TOPROL-XL  Take 50 mg by mouth daily.     multivitamin with minerals Tabs tablet  Take 1 tablet by mouth daily.     ondansetron 4 MG tablet  Commonly known as:  ZOFRAN  Take 4 mg by mouth every 8 (eight) hours as needed for nausea or vomiting.       Follow-up Information    Follow up with Rosario Adie., MD. Schedule an appointment as soon as possible for a visit in 2 weeks.   Specialty:  General Surgery   Contact information:   [address] [place] [address] [phone]       Follow up with [facility] .   Specialty:  Skilled Nursing Facility   Contact information:   [address] [place] [phone]      Signed: Rosario Adie [id number], [id number] PM

## 2015-11-19 NOTE — Progress Notes (Addendum)
5 Days Post-Op lap colostomy reversal Subjective: Ambulating well.  Having BM's and tolerating a diet.  Pain mostly controlled with tylenol and ibuprofen.  Pt does not want to take narcotics.  Objective: Vital signs in last 24 hours: Temp:  [98.1 F (36.7 C)-98.2 F (36.8 C)] 98.1 F (36.7 C) (01/09 0625) Pulse Rate:  [74-84] 76 (01/09 0625) Resp:  [16-18] 16 (01/09 0625) BP: (144-149)/(76-88) 144/88 mmHg (01/09 0625) SpO2:  [98 %-100 %] 98 % (01/09 0625)   Intake/Output from previous day: 01/08 0701 - 01/09 0700 In: 1200 [P.O.:1200] Out: 900 [Urine:900] Intake/Output this shift: Total I/O In: -  Out: 200 [Urine:200] General appearance: alert and cooperative GI: normal findings: soft, non-tender  Incision:  no significant erythema, packing removed  Lab Results:   Recent Labs  11/17/15 0550  WBC 6.2  HGB 10.8*  HCT 34.1*  PLT 220   BMET  Recent Labs  11/17/15 0550  NA 140  K 3.3*  CL 104  CO2 27  GLUCOSE 101*  BUN 5*  CREATININE 0.47  CALCIUM 8.5*   PT/INR No results for input(s): LABPROT, INR in the last 72 hours. ABG No results for input(s): PHART, HCO3 in the last 72 hours.  Invalid input(s): PCO2, PO2  MEDS, Scheduled . acetaminophen  650 mg Oral QID  . atorvastatin  10 mg Oral QODAY  . enoxaparin (LOVENOX) injection  40 mg Subcutaneous Q24H  . hydrochlorothiazide  12.5 mg Oral Daily  . metoprolol succinate  50 mg Oral Daily    Studies/Results: No results found.  Assessment: s/p Procedure(s): LAPAROSCOPIC COLOSTOMY REVERSAL Patient Active Problem List   Diagnosis Date Noted  . Diverticulosis of colon s/o colostomy takedown 11/14/2015 11/14/2015  . Vitamin D deficiency 08/19/2015  . Pedal edema 08/19/2015  . 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 Inguinal hernia with strangulation-right 06/03/2011  . High blood pressure 05/06/2011  . High cholesterol 05/06/2011  . Colon polyp/cancer  05/06/2011  . Wears glasses 05/06/2011  . Seizure ([place]) 05/06/2011    Expected post op course  Plan: Pt ready for discharge    LOS: 5 days     .Rosario Adie, [facility], [place]   11/19/2015 9:13 AM

## 2015-11-19 NOTE — Progress Notes (Signed)
   11/19/15 1044  Berg Balance Test  Sit to Stand 3  Standing Unsupported 4  Sitting with Back Unsupported but Feet Supported on Floor or Stool 2 (increased c/o ABD pain with this activity)  Stand to Sit 2  Transfers 3  Standing Unsupported with Eyes Closed 4  Standing Ubsupported with Feet Together 2  From Standing, Reach Forward with Outstretched Arm 3  From Standing Position, Pick up Object from Floor 2  From Standing Position, Turn to Look Behind Over each Shoulder 4  Turn 360 Degrees 2  Standing Unsupported, Alternately Place Feet on Step/Stool 1  Standing Unsupported, One Foot in Front 2  Standing on One Leg 0  Total Score 34  HIGH FALL RISK     Rica Koyanagi  PTA [facility]  Acute  Rehab Pager      [phone]

## 2015-11-19 NOTE — Discharge Instructions (Signed)
ABDOMINAL SURGERY: POST OP INSTRUCTIONS  1. DIET: Follow a light bland diet the first 24 hours after arrival home, such as soup, liquids, crackers, etc.  Be sure to include lots of fluids daily.  Avoid fast food or heavy meals as your are more likely to get nauseated.  Do not eat any uncooked fruits or vegetables for the next 2 weeks as your colon heals. 2. Take your usually prescribed home medications unless otherwise directed. 3. PAIN CONTROL: a. Pain is best controlled by a usual combination of three different methods TOGETHER: i. Ice/Heat ii. Over the counter pain medication iii. Prescription pain medication if needed b. Most patients will experience some swelling and bruising around the incisions.  Ice packs or heating pads (30-60 minutes up to 6 times a day) will help. Use ice for the first few days to help decrease swelling and bruising, then switch to heat to help relax tight/sore spots and speed recovery.  Some people prefer to use ice alone, heat alone, alternating between ice & heat.  Experiment to what works for you.  Swelling and bruising can take several weeks to resolve.   c. It is helpful to take an over-the-counter pain medication regularly for the first few weeks.  Choose one of the following that works best for you: i. Naproxen (Aleve, etc)  Two 220mg  tabs twice a day ii. Ibuprofen (Advil, etc) Three 200mg  tabs four times a day (every meal & bedtime) iii. Acetaminophen (Tylenol, etc) 500-650mg  four times a day (every meal & bedtime)  iv. If you are having problems/concerns with your pain control, (pain not controlled, nausea, vomiting, rash, itching, etc), please call us [phone] to see if we need to switch you to a different pain medicine that will work better for you and/or control your side effect better. 4. Avoid getting constipated.  Between the surgery and the pain medications, it is common to experience some constipation.  Increasing fluid intake and taking a fiber  supplement (such as Metamucil, Citrucel, FiberCon, MiraLax, etc) 1-2 times a day regularly will usually help prevent this problem from occurring.  A mild laxative (prune juice, Milk of Magnesia, MiraLax, etc) should be taken according to package directions if there are no bowel movements after 48 hours.   5. Watch out for diarrhea.  If you have many loose bowel movements, simplify your diet to bland foods & liquids for a few days.  Stop any stool softeners and decrease your fiber supplement.  Switching to mild anti-diarrheal medications (Kayopectate, Pepto Bismol) can help.  If this worsens or does not improve, please call us. 6. Wash / shower every day.  You may shower over the incision / wound.  Avoid baths until the skin is fully healed.   7. Keep incision covered and change your dressing daily. 8. ACTIVITIES as tolerated:   a. You may resume regular (light) daily activities beginning the next day--such as daily self-care, walking, climbing stairs--gradually increasing activities as tolerated.  If you can walk 30 minutes without difficulty, it is safe to try more intense activity such as jogging, treadmill, bicycling, low-impact aerobics, swimming, etc. b. Save the most intensive and strenuous activity for last such as sit-ups, heavy lifting, contact sports, etc  Refrain from any heavy lifting or straining until you are off narcotics for pain control.   c. DO NOT PUSH THROUGH PAIN.  Let pain be your guide: If it hurts to do something, don't do it.  Pain is your body warning you to  avoid that activity for another week until the pain goes down. d. You may drive when you are no longer taking prescription pain medication, you can comfortably wear a seatbelt, and you can safely maneuver your car and apply brakes. e. Dennis Bast may have sexual intercourse when it is comfortable.  9. FOLLOW UP in our office a. Please call [facility] at [phone] to set up an appointment to see your surgeon in the office for a  follow-up appointment approximately 1-2 weeks after your surgery. b. Make sure that you call for this appointment the day you arrive home to insure a convenient appointment time. 10. IF YOU HAVE DISABILITY OR FAMILY LEAVE FORMS, BRING THEM TO THE OFFICE FOR PROCESSING.  DO NOT GIVE THEM TO YOUR DOCTOR.   WHEN TO CALL us [phone]: 1. Poor pain control 2. Reactions / problems with new medications (rash/itching, nausea, etc)  3. Fever over 101.5 F (38.5 C) 4. Inability to urinate 5. Nausea and/or vomiting 6. Worsening swelling or bruising 7. Continued bleeding from incision. 8. Increased pain, redness, or drainage from the incision  The clinic staff is available to answer your questions during regular business hours (8:30am-5pm).  Please dont hesitate to call and ask to speak to one of our nurses for clinical concerns.   A surgeon from [facility] is always on call at the hospitals   If you have a medical emergency, go to the nearest emergency room or call 911.    [facility], [place], [place], [place], [place]  [address] ? MAIN: [phone]) [phone] ? TOLL FREE: [phone] ? FAX [phone] [email or web address]

## 2015-11-20 ENCOUNTER — Encounter: Payer: Self-pay | Admitting: Internal Medicine

## 2015-11-20 ENCOUNTER — Non-Acute Institutional Stay (SKILLED_NURSING_FACILITY): Payer: 59 | Admitting: Internal Medicine

## 2015-11-20 DIAGNOSIS — I10 Essential (primary) hypertension: Secondary | ICD-10-CM | POA: Diagnosis not present

## 2015-11-20 DIAGNOSIS — J301 Allergic rhinitis due to pollen: Secondary | ICD-10-CM

## 2015-11-20 DIAGNOSIS — K5730 Diverticulosis of large intestine without perforation or abscess without bleeding: Secondary | ICD-10-CM | POA: Diagnosis not present

## 2015-11-20 DIAGNOSIS — E785 Hyperlipidemia, unspecified: Secondary | ICD-10-CM | POA: Diagnosis not present

## 2015-11-20 DIAGNOSIS — J309 Allergic rhinitis, unspecified: Secondary | ICD-10-CM | POA: Insufficient documentation

## 2015-11-20 NOTE — Progress Notes (Addendum)
Patient ID: [name], female   DOB: 1946-04-02, 70 y.o.   MRN: [id number]    HISTORY AND PHYSICAL   DATE: 11/20/15  Location:  [place] [facility] Service: SNF [phone])   Extended Emergency Contact Information Primary Emergency Contact: Emily, Little R Address: [address] [place] [address] [place] Phone: [phone] Mobile Phone: [phone] Relation: Son Secondary Emergency Contact: [name] [place] Phone: [phone] Relation: Sister  Advanced Directive information  FULL CODE  Chief Complaint  Patient presents with  . New Admit To SNF    HPI:  70 yo female seen today as a a new admission into SNF following hospital stay for colostomy takedown, hx diverticulosis of colon with obstruction from stricture s/p colectomy and colostomy in 07/2015. She had an uncomplicated procedure on 1/4th. Diet was advanced without a problem. Narcotics caused her to feel unsteady and pain was controlled with tylenol and ibuprofen. She was sent to SNF for short term rehab and PT  She reports pain in abdomen today uncontrolled on current pain regimen. She noted intermittent loose stools and formed stools but no overt watery diarrhea. No f/c, N/V. Appetite reduced but she attempts to eat some food. No bloody stools. No nursing issues. No falls  HTN - BP controlled on HCTZ and metoprolol. Takes ASA daily  Allergic rhinitis - stable on flonase  hyperlipidemia - stable on lipitor  Insomnia - stable on melatonin  She takes vitamins daily  Past Medical History  Diagnosis Date  . Hypertension   . Hyperlipidemia   . Inguinal hernia unilateral, non-recurrent     right, strangulated-right colon  . Colon polyp   . Environmental allergies   . H/O syncope     x 1 in 2014 - unknown etiology  . Sciatica   . Varicose veins   . Diverticulitis   . Colostomy in place [facility])   . GERD (gastroesophageal  reflux disease)     occasional - takes Mylanta as needed  . History of transfusion   . Stress     sees counselor for "stress"    Past Surgical History  Procedure Laterality Date  . Ectopic pregnancy surgery  1974  . Tonsilectomy, adenoidectomy, bilateral myringotomy and tubes  1950  . Hernia repair  2012    right inguinal  . Colon surgery      right colectomy  . Colectomy with colostomy creation/hartmann procedure N/A 08/08/2015    Procedure: Open [place] Procedure, Sigmoid Resection, Colostomy;  Surgeon: Leighton Ruff, MD;  [facility];  Service: General;  Laterality: N/A;  . Flexible sigmoidoscopy N/A 08/07/2015    Procedure: Beryle Quant;  Surgeon: Teena Irani, MD;  [facility];  Service: Endoscopy;  Laterality: N/A;  . Colonoscopy N/A 11/13/2015    Procedure: COLONOSCOPY;  Surgeon: Leighton Ruff, MD;  [facility];  Service: Endoscopy;  Laterality: N/A;  . Colostomy takedown N/A 11/14/2015    Procedure: LAPAROSCOPIC COLOSTOMY REVERSAL;  Surgeon: Leighton Ruff, MD;  [facility];  Service: General;  Laterality: N/A;    Patient Care Team: Maurice Small, MD as PCP - General (Family Medicine)  Social History   Social History  . Marital Status: Divorced    Spouse Name: N/A  . Number of Children: N/A  . Years of Education: N/A   Occupational History  . Not on file.   Social History  Main Topics  . Smoking status: Never Smoker   . Smokeless tobacco: Never Used  . Alcohol Use: 0.6 oz/week    1 Glasses of wine per week     Comment: socially  . Drug Use: No  . Sexual Activity: No   Other Topics Concern  . Not on file   Social History Narrative     reports that she has never smoked. She has never used smokeless tobacco. She reports that she drinks about 0.6 oz of alcohol per week. She reports that she does not use illicit drugs.  Family History  Problem Relation Age of Onset  . Heart disease Mother   . Cancer Father     melanoma    Family Status  Relation Status Death Age  . Mother Deceased   . Father Deceased   . Brother Marketing executive History  Administered Date(s) Administered  . Influenza-Unspecified 08/22/2015  . Tdap 11/12/2012    Allergies  Allergen Reactions  . Hydrocodone Other (See Comments)    Auditory hallucinations, per pt.   . Promethazine Hcl     Dizziness.  Tolerates ondansetron.    . Simvastatin Other (See Comments)    Muscle pain.     Medications: Patient's Medications  New Prescriptions   No medications on file  Previous Medications   ACETAMINOPHEN (TYLENOL) 325 MG TABLET    Take 2 tablets (650 mg total) by mouth every 6 (six) hours as needed.   ASPIRIN 81 MG TABLET    Take 81 mg by mouth daily.    ATORVASTATIN (LIPITOR) 10 MG TABLET    Take 10 mg by mouth every other day.   CHOLECALCIFEROL (VITAMIN D) 1000 UNITS TABLET    Take 1,000 Units by mouth daily.   FLUTICASONE (FLONASE) 50 MCG/ACT NASAL SPRAY    Place 2 sprays into both nostrils daily as needed for allergies.    HYDROCHLOROTHIAZIDE 25 MG TABLET    Take 12.5 mg by mouth daily.    IBUPROFEN (ADVIL,MOTRIN) 400 MG TABLET    Take 1-2 tablets (400-800 mg total) by mouth every 6 (six) hours as needed for fever or headache.   MELATONIN 10 MG TABS    Take 10 mg by mouth at bedtime.   METOPROLOL (TOPROL-XL) 50 MG 24 HR TABLET    Take 50 mg by mouth daily.     MULTIPLE VITAMIN (MULTIVITAMIN WITH MINERALS) TABS    Take 1 tablet by mouth daily.   ONDANSETRON (ZOFRAN) 4 MG TABLET    Take 4 mg by mouth every 8 (eight) hours as needed for nausea or vomiting.   Modified Medications   No medications on file  Discontinued Medications   No medications on file    Review of Systems  Gastrointestinal: Positive for abdominal pain and abdominal distention.  All other systems reviewed and are negative.   Filed Vitals:   11/20/15 1542  BP: 149/79  Pulse: 76  Temp: 97 F (36.1 C)  Weight: 128 lb 9.6 oz (58.333 kg)  SpO2: 97%    Body mass index is 21.4 kg/(m^2).  Physical Exam  Constitutional: She is oriented to person, place, and time. She appears well-developed and well-nourished.  Sitting up in bed in NAD. Looks uncomfortable  HENT:  Mouth/Throat: Oropharynx is clear and moist. No oropharyngeal exudate.  Eyes: Pupils are equal, round, and reactive to light. No scleral icterus.  Neck: Neck supple. Carotid bruit is not present. No tracheal deviation present. No thyromegaly present.  Cardiovascular: Normal rate, regular rhythm, normal heart sounds and intact distal pulses.  Exam reveals no gallop and no friction rub.   No murmur heard. No LE edema b/l. no calf TTP.   Pulmonary/Chest: Effort normal and breath sounds normal. No stridor. No respiratory distress. She has no wheezes. She has no rales.  Abdominal: Soft. Bowel sounds are normal. She exhibits distension. She exhibits no mass. There is no hepatomegaly. There is tenderness (generally). There is no rebound and no guarding.  Laparoscopic incisions healing, no redness or d/c. Dressing on left abdomen c/d/i.   Lymphadenopathy:    She has no cervical adenopathy.  Neurological: She is alert and oriented to person, place, and time.  Skin: Skin is warm and dry. No rash noted.  Psychiatric: She has a normal mood and affect. Her behavior is normal. Judgment and thought content normal.     Labs reviewed: Admission on 11/14/2015, Discharged on 11/19/2015  Component Date Value Ref Range Status  . Hgb A1c MFr Bld 11/14/2015 5.3  4.8 - 5.6 % Final   Comment: (NOTE)         Pre-diabetes: 5.7 - 6.4         Diabetes: >6.4         Glycemic control for adults with diabetes: <7.0   . Mean Plasma Glucose 11/14/2015 105   Final   Comment: (NOTE) Performed At: [facility] [place], [place] [id number] Lindon Romp MD [id number]   . Sodium 11/15/2015 139  135 - 145 mmol/L Final  . Potassium 11/15/2015 3.6  3.5 - 5.1 mmol/L Final  .  Chloride 11/15/2015 100* 101 - 111 mmol/L Final  . CO2 11/15/2015 29  22 - 32 mmol/L Final  . Glucose, Bld 11/15/2015 127* 65 - 99 mg/dL Final  . BUN 11/15/2015 8  6 - 20 mg/dL Final  . Creatinine, Ser 11/15/2015 0.66  0.44 - 1.00 mg/dL Final  . Calcium 11/15/2015 8.7* 8.9 - 10.3 mg/dL Final  . GFR calc non Af Amer 11/15/2015 >60  >60 mL/min Final  . GFR calc Af Amer 11/15/2015 >60  >60 mL/min Final   Comment: (NOTE) The eGFR has been calculated using the CKD EPI equation. This calculation has not been validated in all clinical situations. eGFR's persistently <60 mL/min signify possible Chronic Kidney Disease.   . Anion gap 11/15/2015 10  5 - 15 Final  . WBC 11/15/2015 10.2  4.0 - 10.5 K/uL Final  . RBC 11/15/2015 3.84* 3.87 - 5.11 MIL/uL Final  . Hemoglobin 11/15/2015 10.7* 12.0 - 15.0 g/dL Final  . HCT 11/15/2015 33.5* 36.0 - 46.0 % Final  . MCV 11/15/2015 87.2  78.0 - 100.0 fL Final  . MCH 11/15/2015 27.9  26.0 - 34.0 pg Final  . MCHC 11/15/2015 31.9  30.0 - 36.0 g/dL Final  . RDW 11/15/2015 12.6  11.5 - 15.5 % Final  . Platelets 11/15/2015 215  150 - 400 K/uL Final  . Sodium 11/16/2015 142  135 - 145 mmol/L Final  . Potassium 11/16/2015 3.5  3.5 - 5.1 mmol/L Final  . Chloride 11/16/2015 108  101 - 111 mmol/L Final  . CO2 11/16/2015 29  22 - 32 mmol/L Final  . Glucose, Bld 11/16/2015 103* 65 - 99 mg/dL Final  . BUN 11/16/2015 <5* 6 - 20 mg/dL Final  . Creatinine, Ser 11/16/2015 0.55  0.44 - 1.00 mg/dL Final  . Calcium 11/16/2015 8.7* 8.9 - 10.3 mg/dL Final  . GFR calc non  Af Amer 11/16/2015 >60  >60 mL/min Final  . GFR calc Af Amer 11/16/2015 >60  >60 mL/min Final   Comment: (NOTE) The eGFR has been calculated using the CKD EPI equation. This calculation has not been validated in all clinical situations. eGFR's persistently <60 mL/min signify possible Chronic Kidney Disease.   . Anion gap 11/16/2015 5  5 - 15 Final  . WBC 11/16/2015 6.3  4.0 - 10.5 K/uL Final  . RBC  11/16/2015 3.59* 3.87 - 5.11 MIL/uL Final  . Hemoglobin 11/16/2015 10.3* 12.0 - 15.0 g/dL Final  . HCT 11/16/2015 32.0* 36.0 - 46.0 % Final  . MCV 11/16/2015 89.1  78.0 - 100.0 fL Final  . MCH 11/16/2015 28.7  26.0 - 34.0 pg Final  . MCHC 11/16/2015 32.2  30.0 - 36.0 g/dL Final  . RDW 11/16/2015 13.1  11.5 - 15.5 % Final  . Platelets 11/16/2015 189  150 - 400 K/uL Final  . Sodium 11/17/2015 140  135 - 145 mmol/L Final  . Potassium 11/17/2015 3.3* 3.5 - 5.1 mmol/L Final  . Chloride 11/17/2015 104  101 - 111 mmol/L Final  . CO2 11/17/2015 27  22 - 32 mmol/L Final  . Glucose, Bld 11/17/2015 101* 65 - 99 mg/dL Final  . BUN 11/17/2015 5* 6 - 20 mg/dL Final  . Creatinine, Ser 11/17/2015 0.47  0.44 - 1.00 mg/dL Final  . Calcium 11/17/2015 8.5* 8.9 - 10.3 mg/dL Final  . GFR calc non Af Amer 11/17/2015 >60  >60 mL/min Final  . GFR calc Af Amer 11/17/2015 >60  >60 mL/min Final   Comment: (NOTE) The eGFR has been calculated using the CKD EPI equation. This calculation has not been validated in all clinical situations. eGFR's persistently <60 mL/min signify possible Chronic Kidney Disease.   . Anion gap 11/17/2015 9  5 - 15 Final  . WBC 11/17/2015 6.2  4.0 - 10.5 K/uL Final  . RBC 11/17/2015 3.83* 3.87 - 5.11 MIL/uL Final  . Hemoglobin 11/17/2015 10.8* 12.0 - 15.0 g/dL Final  . HCT 11/17/2015 34.1* 36.0 - 46.0 % Final  . MCV 11/17/2015 89.0  78.0 - 100.0 fL Final  . MCH 11/17/2015 28.2  26.0 - 34.0 pg Final  . MCHC 11/17/2015 31.7  30.0 - 36.0 g/dL Final  . RDW 11/17/2015 12.8  11.5 - 15.5 % Final  . Platelets 11/17/2015 220  150 - 400 K/uL Final  Hospital Outpatient Visit on 11/06/2015  Component Date Value Ref Range Status  . Hgb A1c MFr Bld 11/06/2015 5.4  4.8 - 5.6 % Final   Comment: (NOTE)         Pre-diabetes: 5.7 - 6.4         Diabetes: >6.4         Glycemic control for adults with diabetes: <7.0   . Mean Plasma Glucose 11/06/2015 108   Final   Comment: (NOTE) Performed At: [facility] [place], [place] [id number] Lindon Romp MD [id number]   . WBC 11/06/2015 5.9  4.0 - 10.5 K/uL Final  . RBC 11/06/2015 4.57  3.87 - 5.11 MIL/uL Final  . Hemoglobin 11/06/2015 12.9  12.0 - 15.0 g/dL Final  . HCT 11/06/2015 40.0  36.0 - 46.0 % Final  . MCV 11/06/2015 87.5  78.0 - 100.0 fL Final  . MCH 11/06/2015 28.2  26.0 - 34.0 pg Final  . MCHC 11/06/2015 32.3  30.0 - 36.0 g/dL Final  . RDW 11/06/2015 12.8  11.5 -  15.5 % Final  . Platelets 11/06/2015 240  150 - 400 K/uL Final  . Sodium 11/06/2015 141  135 - 145 mmol/L Final  . Potassium 11/06/2015 3.9  3.5 - 5.1 mmol/L Final  . Chloride 11/06/2015 104  101 - 111 mmol/L Final  . CO2 11/06/2015 29  22 - 32 mmol/L Final  . Glucose, Bld 11/06/2015 103* 65 - 99 mg/dL Final  . BUN 11/06/2015 11  6 - 20 mg/dL Final  . Creatinine, Ser 11/06/2015 0.61  0.44 - 1.00 mg/dL Final  . Calcium 11/06/2015 9.4  8.9 - 10.3 mg/dL Final  . GFR calc non Af Amer 11/06/2015 >60  >60 mL/min Final  . GFR calc Af Amer 11/06/2015 >60  >60 mL/min Final   Comment: (NOTE) The eGFR has been calculated using the CKD EPI equation. This calculation has not been validated in all clinical situations. eGFR's persistently <60 mL/min signify possible Chronic Kidney Disease.   . Anion gap 11/06/2015 8  5 - 15 Final  . ABO/RH(D) 11/06/2015 O POS   Final  . Antibody Screen 11/06/2015 NEG   Final  . Sample Expiration 11/06/2015 11/17/2015   Final  . Extend sample reason 11/06/2015 NO TRANSFUSIONS OR PREGNANCY IN THE PAST 3 MONTHS   Final    No results found.   Assessment/Plan   ICD-9-CM ICD-10-CM   1. Diverticulosis of large intestine without hemorrhage 562.10 K57.30   2. Benign essential HTN 401.1 I10   3. Hyperlipidemia LDL goal <100 272.4 E78.5   4. Allergic rhinitis due to pollen 477.0 J30.1     Schedule pain med - take 2 tabs Tylenol ES TID and ibuprofen '400mg'$  at 10Am and 10PM  Cont other meds as ordered  PT/OT as  ordered  F/u with surgery as scheduled  GOAL: short term rehab and d/c home when medically appropriate. Communicated with pt and nursing.  Will follow  [place] S. Perlie Gold  [facility] [address] [place], [place] [address] [phone] Cell (Monday-Friday 8 AM - 5 PM) [phone] After 5 PM and follow prompts

## 2015-11-22 ENCOUNTER — Non-Acute Institutional Stay (SKILLED_NURSING_FACILITY): Payer: 59 | Admitting: Adult Health

## 2015-11-22 ENCOUNTER — Encounter: Payer: Self-pay | Admitting: Adult Health

## 2015-11-22 DIAGNOSIS — K5730 Diverticulosis of large intestine without perforation or abscess without bleeding: Secondary | ICD-10-CM

## 2015-11-22 DIAGNOSIS — Z9889 Other specified postprocedural states: Secondary | ICD-10-CM | POA: Diagnosis not present

## 2015-11-22 DIAGNOSIS — K5660 Unspecified intestinal obstruction: Secondary | ICD-10-CM

## 2015-11-22 DIAGNOSIS — K56609 Unspecified intestinal obstruction, unspecified as to partial versus complete obstruction: Secondary | ICD-10-CM

## 2015-11-22 NOTE — Progress Notes (Signed)
Patient ID: [name], female   DOB: 1946/06/19, 70 y.o.   MRN: [id number]    Facility: [facility]      Allergies  Allergen Reactions  . Hydrocodone Other (See Comments)    Auditory hallucinations, per pt.   . Promethazine Hcl     Dizziness.  Tolerates ondansetron.    . Simvastatin Other (See Comments)    Muscle pain.     Chief Complaint  Patient presents with  . Discharge Note    HPI:  She is being discharged to home with home health for pt/rn. She will not need any dme. She will need her prescriptions to be written and will need to follow up with her pcp.  She had been hospitalized for a colostomy take down. Was admitted to this facility for short term rehab.    Past Medical History  Diagnosis Date  . Hypertension   . Hyperlipidemia   . Inguinal hernia unilateral, non-recurrent     right, strangulated-right colon  . Colon polyp   . Environmental allergies   . H/O syncope     x 1 in 2014 - unknown etiology  . Sciatica   . Varicose veins   . Diverticulitis   . Colostomy in place [facility])   . GERD (gastroesophageal reflux disease)     occasional - takes Mylanta as needed  . History of transfusion   . Stress     sees counselor for "stress"    Past Surgical History  Procedure Laterality Date  . Ectopic pregnancy surgery  1974  . Tonsilectomy, adenoidectomy, bilateral myringotomy and tubes  1950  . Hernia repair  2012    right inguinal  . Colon surgery      right colectomy  . Colectomy with colostomy creation/hartmann procedure N/A 08/08/2015    Procedure: Open [place] Procedure, Sigmoid Resection, Colostomy;  Surgeon: Leighton Ruff, MD;  [facility];  Service: General;  Laterality: N/A;  . Flexible sigmoidoscopy N/A 08/07/2015    Procedure: Beryle Quant;  Surgeon: Teena Irani, MD;  [facility];  Service: Endoscopy;  Laterality: N/A;  . Colonoscopy N/A 11/13/2015    Procedure: COLONOSCOPY;  Surgeon: Leighton Ruff, MD;  [facility];  Service: Endoscopy;  Laterality: N/A;  . Colostomy takedown N/A 11/14/2015    Procedure: LAPAROSCOPIC COLOSTOMY REVERSAL;  Surgeon: Leighton Ruff, MD;  Location[facility];  Service: General;  Laterality: N/A;    VITAL SIGNS BP 131/75 mmHg  Pulse 64  Temp(Src) 97.6 F (36.4 C)  Ht 5' (1.524 m)  Wt 127 lb 4.8 oz (57.743 kg)  BMI 24.86 kg/m2  SpO2 97%  Patient's Medications  New Prescriptions   No medications on file  Previous Medications   ACETAMINOPHEN (TYLENOL) 500 MG TABLET    Take 1,000 mg by mouth every 8 (eight) hours.   ASPIRIN 81 MG TABLET    Take 81 mg by mouth daily.    ATORVASTATIN (LIPITOR) 10 MG TABLET    Take 10 mg by mouth every other day.   CHOLECALCIFEROL (VITAMIN D) 1000 UNITS TABLET    Take 1,000 Units by mouth daily.   FLUTICASONE (FLONASE) 50 MCG/ACT NASAL SPRAY    Place 2 sprays into both nostrils daily as needed for allergies.    HYDROCHLOROTHIAZIDE 25 MG TABLET    Take 12.5 mg by mouth daily.    IBUPROFEN (ADVIL,MOTRIN) 400 MG TABLET    Take 400 mg by mouth 2 (two) times daily.   MELATONIN 10 MG TABS  Take 10 mg by mouth at bedtime.   METOPROLOL (TOPROL-XL) 50 MG 24 HR TABLET    Take 50 mg by mouth daily.     MULTIPLE VITAMIN (MULTIVITAMIN WITH MINERALS) TABS    Take 1 tablet by mouth daily.   ONDANSETRON (ZOFRAN) 4 MG TABLET    Take 4 mg by mouth every 8 (eight) hours as needed for nausea or vomiting.   Modified Medications   No medications on file  Discontinued Medications     SIGNIFICANT DIAGNOSTIC EXAMS   LABS REVIEWED:   11-14-15: hgb a1c 5.3 11-17-15: wbc 6.2; hgb 10.8; hct 34.1; mcv 89.0; plt 220; glucose 103; bun <5; creat 0.55; k+ 3.5; na++142    Review of Systems  Constitutional: Negative for malaise/fatigue.  Respiratory: Negative for cough and shortness of breath.   Cardiovascular: Negative for chest pain, palpitations and leg swelling.  Gastrointestinal: Positive for abdominal pain. Negative for heartburn, diarrhea and  constipation.       Pain from surgery is being managed with current regimen   Musculoskeletal: Negative for myalgias and joint pain.  Skin:       Has incision line   Neurological: Negative for dizziness and headaches.  Psychiatric/Behavioral: The patient is not nervous/anxious.     Physical Exam  Constitutional: She is oriented to person, place, and time. No distress.  Eyes: Conjunctivae are normal.  Neck: Neck supple. No JVD present. No thyromegaly present.  Cardiovascular: Normal rate, regular rhythm and intact distal pulses.   Respiratory: Effort normal and breath sounds normal. No respiratory distress. She has no wheezes.  GI: Soft. Bowel sounds are normal. She exhibits no distension. There is no tenderness.  Musculoskeletal: She exhibits no edema.  Able to move all extremities   Lymphadenopathy:    She has no cervical adenopathy.  Neurological: She is alert and oriented to person, place, and time.  Skin: Skin is warm and dry. She is not diaphoretic.  Incision line without signs of infection   Psychiatric: She has a normal mood and affect.      ASSESSMENT/ PLAN:  She is being discharged to home with home health for pt/rn to evaluate and treat as indicated for strength; balance; gait and medication management. She will not need dme. Her prescriptions have been written for a 30 supply of her medications. She has a follow up with Dr. Justin Mend on 12-03-14 at 10:15 am     Time spent with patient 40   minutes >50% time spent counseling; reviewing medical record; tests; labs; and developing future plan of care   Ok Edwards NP [facility]  Contact [phone] Monday through Friday 8am- 5pm  After hours call [phone]

## 2015-11-26 ENCOUNTER — Encounter: Payer: Self-pay | Admitting: General Surgery

## 2016-09-04 ENCOUNTER — Other Ambulatory Visit: Payer: Self-pay | Admitting: Family Medicine

## 2016-09-04 DIAGNOSIS — Z1231 Encounter for screening mammogram for malignant neoplasm of breast: Secondary | ICD-10-CM

## 2016-09-18 ENCOUNTER — Ambulatory Visit: Payer: 59

## 2016-09-19 ENCOUNTER — Ambulatory Visit
Admission: RE | Admit: 2016-09-19 | Discharge: 2016-09-19 | Disposition: A | Discharge status: Home / Self Care | Payer: 59 | Source: Ambulatory Visit | Attending: Family Medicine | Admitting: Family Medicine

## 2016-09-19 DIAGNOSIS — Z1231 Encounter for screening mammogram for malignant neoplasm of breast: Secondary | ICD-10-CM

## 2016-09-26 IMAGING — CT CT ABD-PELV W/ CM
2 of 5 series · 15 of 46 positions shown, 17 images · IV contrast (iopamidol)
Comparison: 01/15/2007

CLINICAL DATA: Bloating and tightness.  Nausea

EXAM:
CT ABDOMEN AND PELVIS WITH CONTRAST
TECHNIQUE: Multidetector CT imaging of the abdomen and pelvis was performed
using the standard protocol following bolus administration of
intravenous contrast.
CONTRAST:  100mL [id number] IOPAMIDOL (0XZO43-AGG) INJECTION 61%

[Series 2: abd/pelvis w/cm · axial · 0.67mm/px · z∈[-422,-27]mm · 12 of 89 slices shown, 14 images]
[im 5/89  soft-tissue]
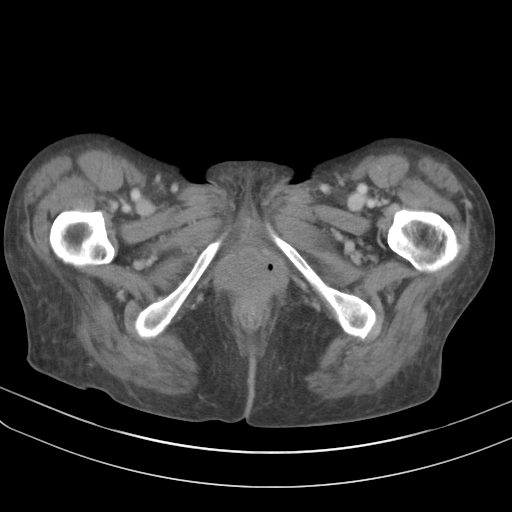
[im 5/89  bone]
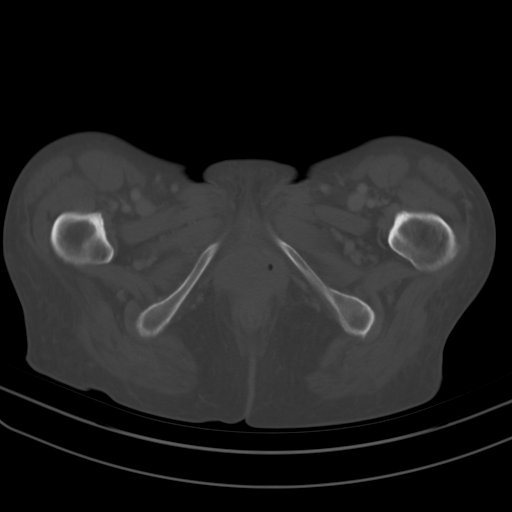
[im 15/89  soft-tissue]
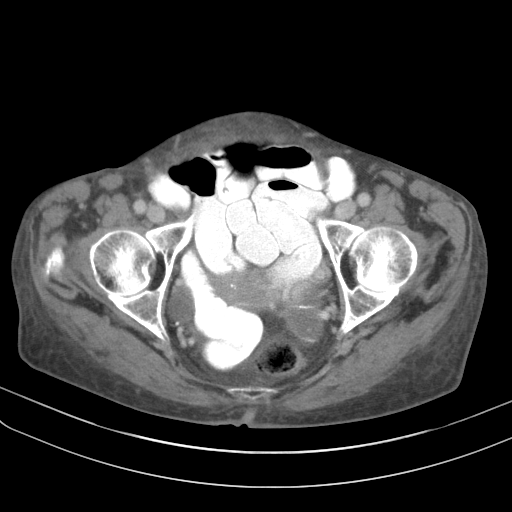
[im 20/89  soft-tissue]
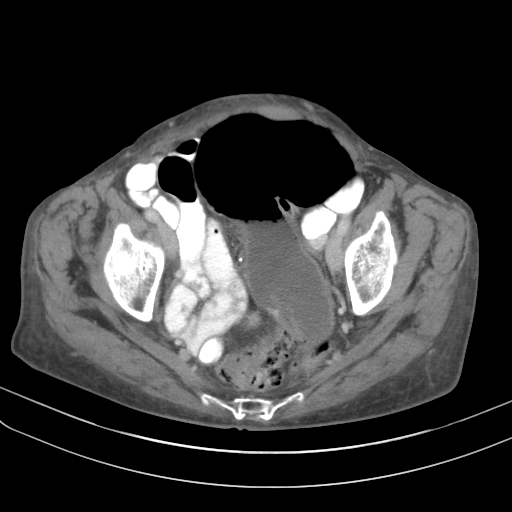
[im 25/89  soft-tissue]
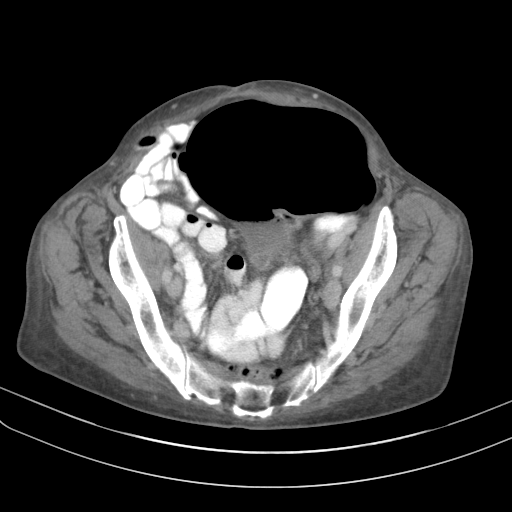
[im 35/89  soft-tissue]
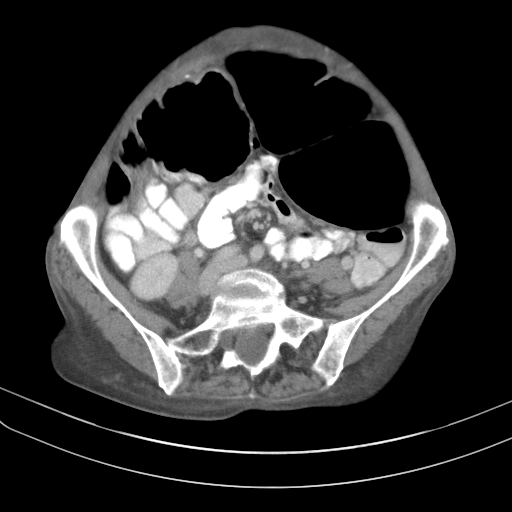
[im 40/89  soft-tissue]
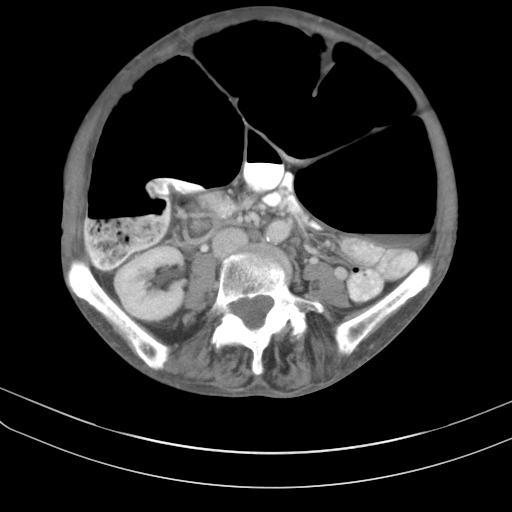
[im 49/89  soft-tissue]
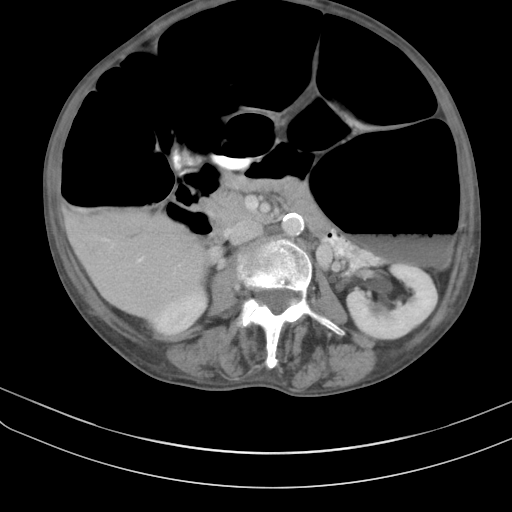
[im 54/89  soft-tissue]
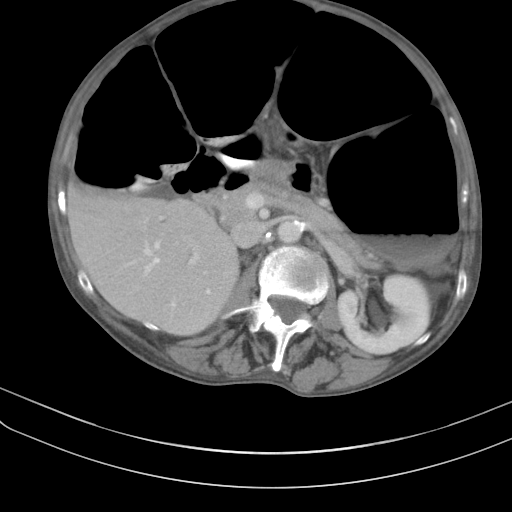
[im 64/89  soft-tissue]
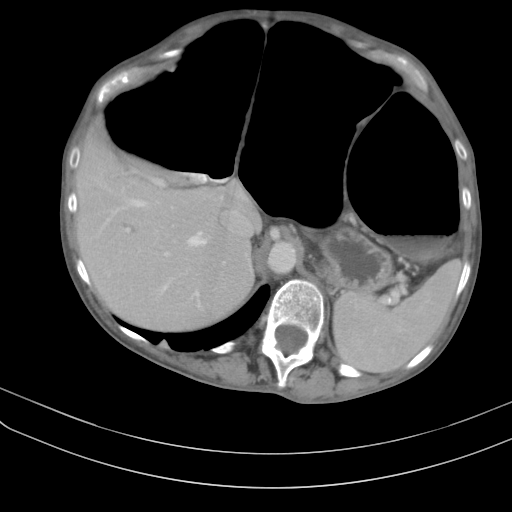
[im 64/89  bone]
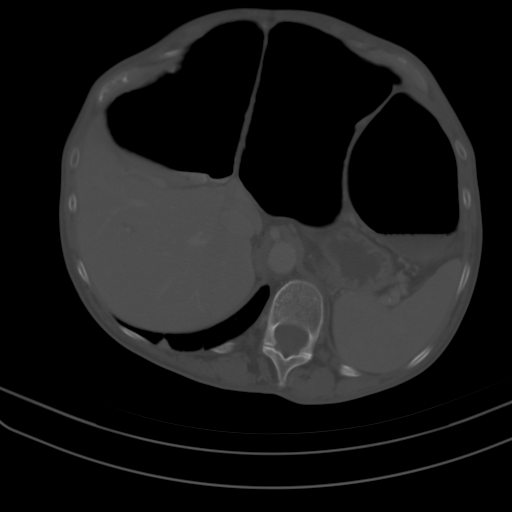
[im 69/89  soft-tissue]
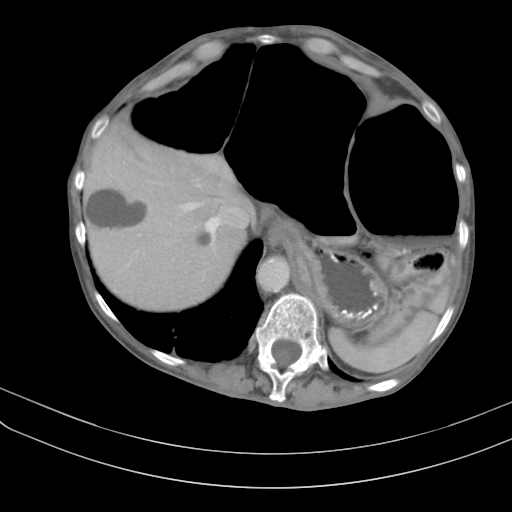
[im 74/89  soft-tissue]
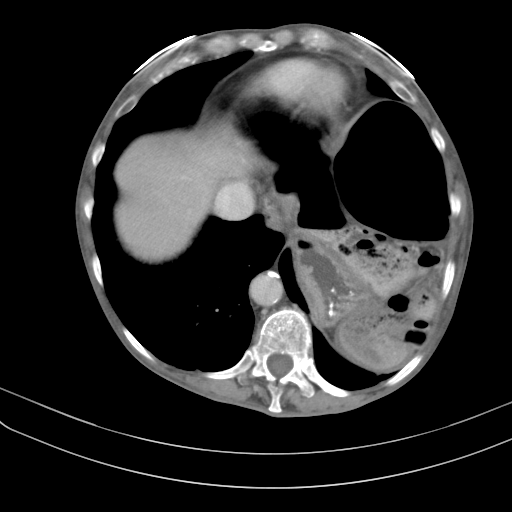
[im 84/89  soft-tissue]
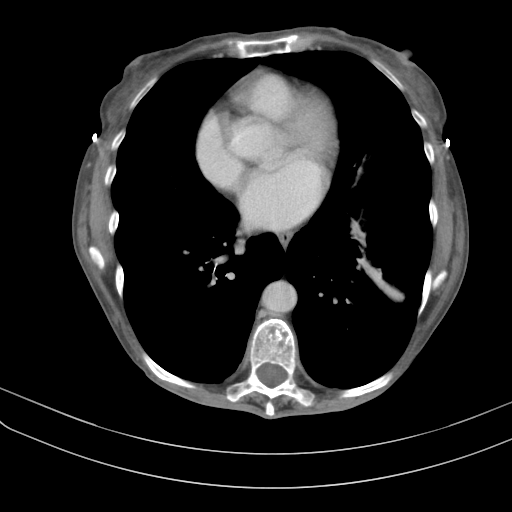

[Series 3: cor · coronal · 0.67mm/px · 3 of 88 slices shown]
[im 30/88  soft-tissue]
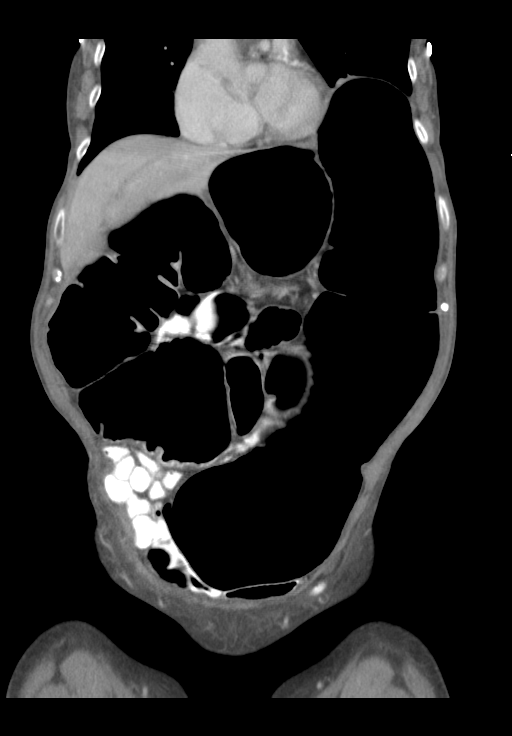
[im 39/88  soft-tissue]
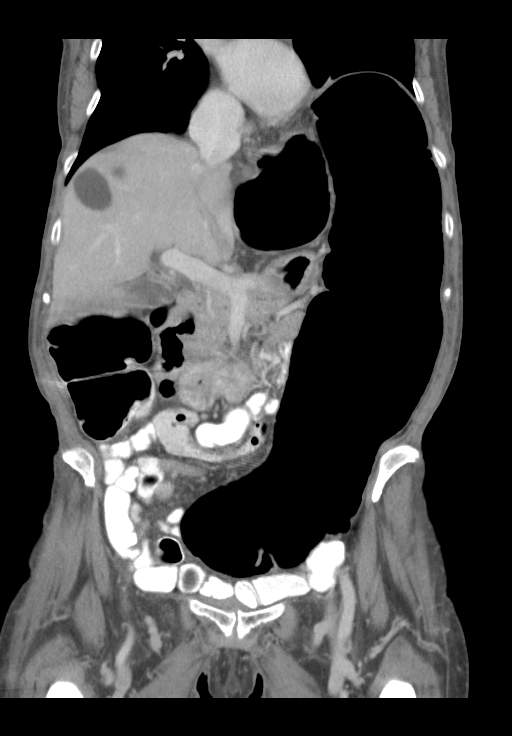
[im 49/88  soft-tissue]
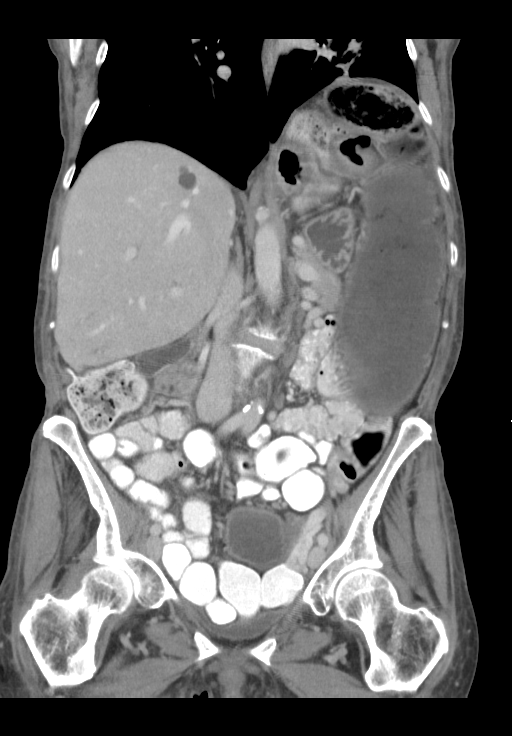

[15 of 46 positions shown; findings below may reference images not displayed]

FINDINGS: Lower chest:  No pleural effusion.  The lung bases appear clear.

Hepatobiliary: There is no suspicious liver abnormality identified.
Multiple cysts are identified within the liver as before. The
gallbladder appears normal. No biliary dilatation.

Pancreas: Normal appearance of the pancreas.

Spleen: The spleen is unremarkable.

Adrenals/Urinary Tract: Normal appearance of the adrenal glands. The
scratch set cyst is noted within the upper pole of left kidney.
Right kidney appears normal. The urinary bladder is within normal
limits.

Stomach/Bowel: The stomach appears within normal limits. The small
bowel loops have a normal course and caliber without evidence for
obstruction. There is diffuse distension of the colon up to the
level of the colorectal anastomosis within the central pelvis, image
72/ series 2. This is at the level of the suture line. The colon
goes from a maximum diameter of 9.7 cm, image 49/series 22 2.9 cm at
the level of the anastomosis, image 72/series 2. Stool is identified
within scratch set there is a small amount of stool identified
distal to the anastomosis.

Vascular/Lymphatic: Calcified atherosclerotic disease involves the
abdominal aorta. No aneurysm. No enlarged retroperitoneal or
mesenteric adenopathy. No enlarged pelvic or inguinal lymph nodes.

Reproductive: The uterus and the adnexal structures are on
unremarkable.

Other: There is no free fluid or fluid collections within the
abdomen or pelvis.

Musculoskeletal: Normal scratch set no aggressive lytic or sclerotic
bone lesions identified. Scoliosis deformity involving the thoracic
and lumbar spine is convex towards the left.
IMPRESSION: 1. Marked distension of the colon up to the level of the colorectal
anastomosis within the pelvis. Findings may reflect an unusually
tight surgical anastomosis, postsurgical stricturing or underlying
mass. Correlation with direct visualization suggested. There is no
significant small bowel dilatation.
2. Aortic atherosclerosis
3. Liver and kidney cysts.
4. Scoliosis.

## 2016-09-30 IMAGING — CR DG ABDOMEN ACUTE W/ 1V CHEST
4 series · 4 of 4 positions shown · non-contrast
Comparison: 08/02/2015 CT abdomen and pelvis

CLINICAL DATA: Admitted to hospital today with severe abdominal
pain, nausea, and abdominal distension which has been occurring off
and on for 2 months, not passing gas, only passing small amount of
liquid stool, 20 lb weight loss over 2 months, anorexia

EXAM:
DG ABDOMEN ACUTE W/ 1V CHEST

[w chest pa]
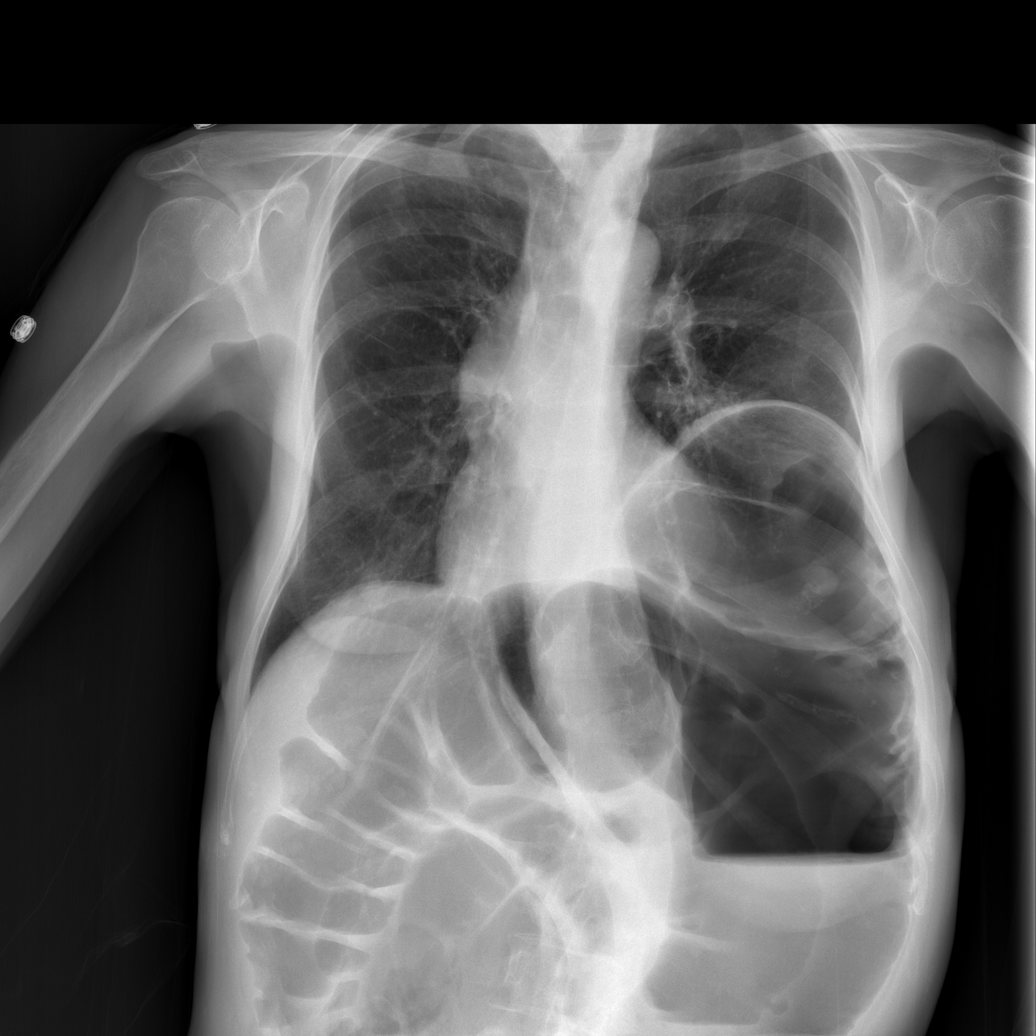

[w abdomen upright *]
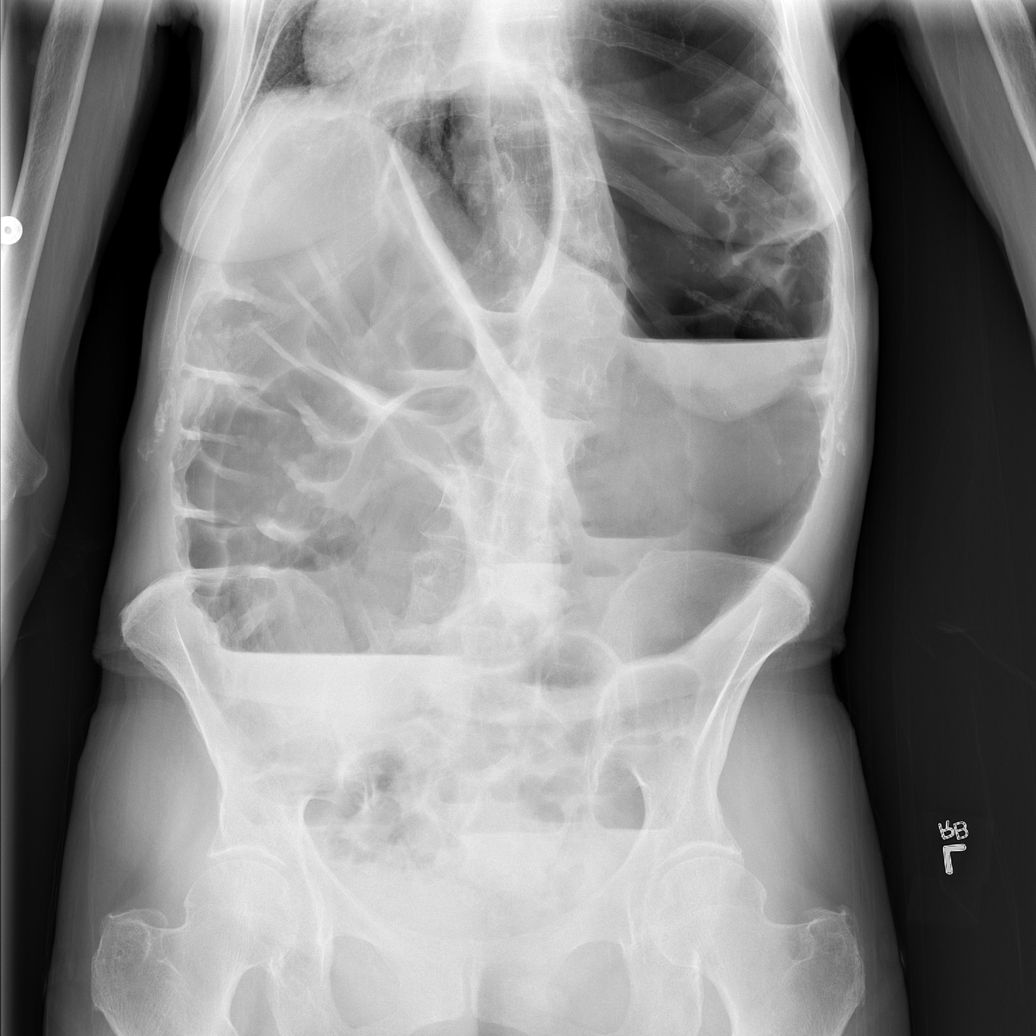

[t abdomen supine (1 of 2)]
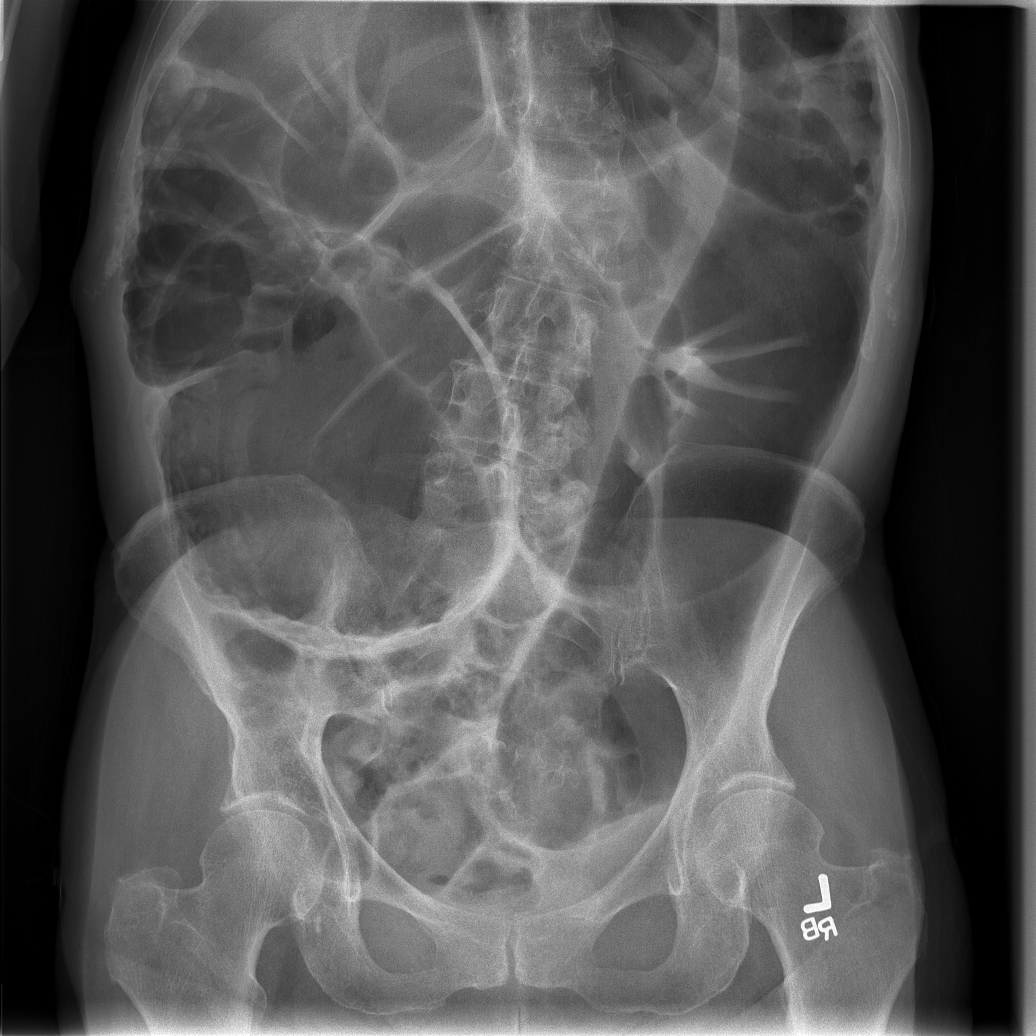

[t abdomen supine (2 of 2)]
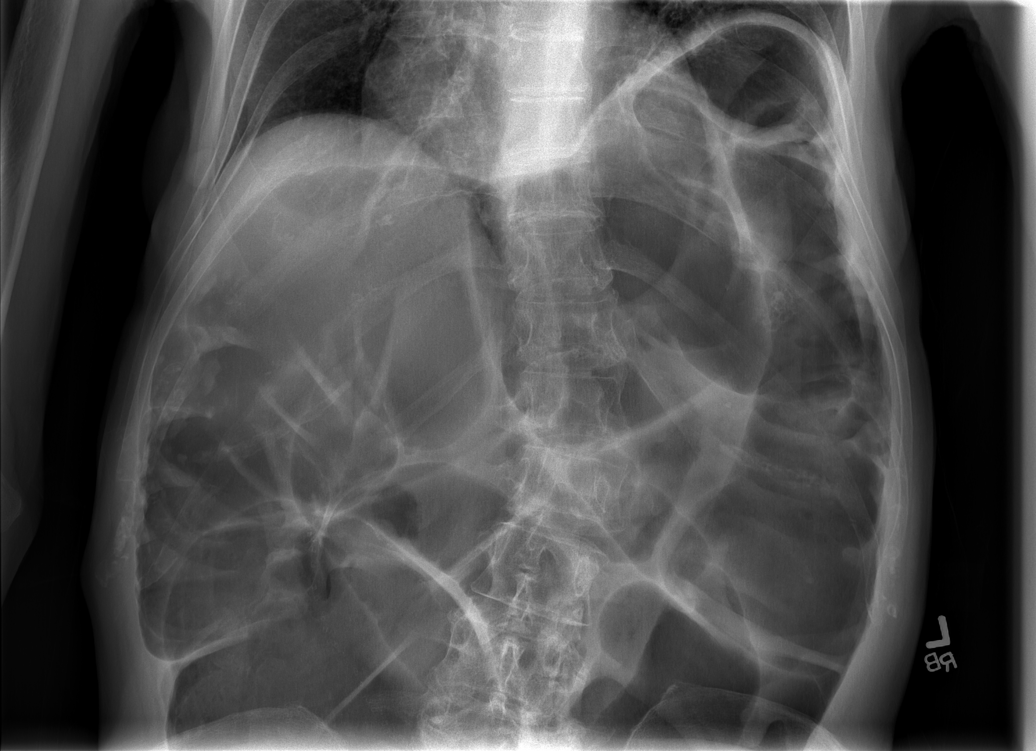

[4 of 4 positions shown; findings below may reference images not displayed]

FINDINGS: Normal heart size, mediastinal contours and pulmonary vascularity.

Emphysematous and bronchitic changes consistent with COPD.

Significant elevation of LEFT diaphragm.

Mild LEFT basilar atelectasis.

No definite infiltrate, pleural effusion or pneumothorax.

Diffuse osseous demineralization.

Significant gaseous distention of the colon with cecum up to 14.8 cm
diameter.

No definite free intraperitoneal air identified.

Minimal small bowel gas dilatation.

No definite bowel wall thickening.

Bones severely demineralized with levoconvex thoracolumbar scoliosis
and scattered degenerative changes.
IMPRESSION: Significant gaseous distention of the colon to the level of the
sigmoid colon; prior CT exam demonstrated a rectosigmoid
obstruction.

Cecum measures approximately 14.8 cm transverse diameter but no
definite free intraperitoneal air is identified.

Findings called to Addy RN on 5W on 08/06/2015 at 7211 hour.

## 2018-03-22 ENCOUNTER — Other Ambulatory Visit: Payer: Self-pay | Admitting: Family Medicine

## 2018-03-22 DIAGNOSIS — Z1231 Encounter for screening mammogram for malignant neoplasm of breast: Secondary | ICD-10-CM

## 2018-04-12 ENCOUNTER — Ambulatory Visit
Admission: RE | Admit: 2018-04-12 | Discharge: 2018-04-12 | Disposition: A | Discharge status: Home / Self Care | Payer: 59 | Source: Ambulatory Visit | Attending: Family Medicine | Admitting: Family Medicine

## 2018-04-12 DIAGNOSIS — Z1231 Encounter for screening mammogram for malignant neoplasm of breast: Secondary | ICD-10-CM

## 2018-06-11 NOTE — Progress Notes (Signed)
Formatting of this note might be different from the original.    Subjective:    Patient ID: Ronita HippsJane P Pitt is a [age] y.o. female here for a Vaccines visit.    Have you ever fainted, nearly fainted or been concerned about fainting after receiving an injection/vaccine?: No Are you sick today with a moderate to severe illness?: No Have you ever had a serious reaction to any vaccine in the past?: No   Has the VIS been reviewed?: Yes      Lifestyle: Erskine SquibbJane has no tobacco history on file.   Objective:    Assessment/Plan:    Vaccine administered in accordance with [facility] guidelines.    Patient advised to contact [facility] if adverse event occurs.   Electronically signed by Pearletha FurlLaquasha Watkins, NP at 06/11/2018  7:30 PM EDT

## 2020-02-14 ENCOUNTER — Encounter

## 2020-02-28 ENCOUNTER — Inpatient Hospital Stay: Admit: 2020-02-28 | Payer: MEDICARE | Attending: "Endocrinology | Primary: Internal Medicine

## 2020-02-28 DIAGNOSIS — M810 Age-related osteoporosis without current pathological fracture: Secondary | ICD-10-CM

## 2022-09-18 ENCOUNTER — Inpatient Hospital Stay: Payer: Medicare (Managed Care) | Attending: Cardiovascular Disease

## 2022-09-18 LAB — ELECTROPHYSIOLOGY PROCEDURE: Body Surface Area: 1.71 m2

## 2022-09-18 MED ORDER — LIDOCAINE HCL 1% INJ (MIXTURES ONLY)
1 % | INTRAMUSCULAR | Status: DC | PRN
Start: 2022-09-18 — End: 2022-09-18
  Administered 2022-09-18: 17:00:00 10 via INTRADERMAL

## 2022-09-18 MED ORDER — LIDOCAINE HCL 1 % IJ SOLN
1 % | INTRAMUSCULAR | Status: AC
Start: 2022-09-18 — End: ?

## 2022-09-18 MED FILL — LIDOCAINE HCL 1 % IJ SOLN: 1 % | INTRAMUSCULAR | Qty: 20

## 2022-09-18 NOTE — Progress Notes (Addendum)
Patient discharged via ambulatory (no sedation) to home accompanied by friend.

## 2022-09-18 NOTE — Progress Notes (Signed)
Cardiac Cath Lab Recovery Arrival Note:      Jillian Gaines arrived to Cardiac Cath Lab, Recovery Area. Staff introduced to patient. Patient identifiers verified with NAME and DATE OF BIRTH. Procedure verified with patient. Consent forms reviewed and signed by patient or authorized representative and verified. Allergies verified.     Patient and family oriented to department. Patient and family informed of procedure and plan of care.     Questions answered with review. Patient prepped for procedure, per orders from physician, prior to arrival.    Patient on cardiac monitor, non-invasive blood pressure, SPO2 monitor. On room air. Patient is A&Ox 4. Patient reports no pain.     Patient in stretcher, in low position, with side rails up, call bell within reach, patient instructed to call if assistance as needed.    Patient prep in: [place], [place] [address].   Patient family has pager # [phone]  Family in: Friend in waiting room-Glenda  Prep by: DD

## 2022-09-18 NOTE — Discharge Instructions (Signed)
PATIENT INSTRUCTIONS POST-ILR IMPLANT    1.  No heavy lifting or exercises with the left arm for 1 week.        2.  Do not shower for 7 days after discharge from the hospital.  Sponge baths are O.K., provided the ILR site is kept dry and the  affected arm is not raised and movement is limited.    3.  Please do not remove steri-strips.    4.  Do not drive for 1 week.    5.  Call Dr. Carolyn Stare [phone] if you experience any of the following symptoms:  1. Redness at the ILR site  2. Swelling at or around the ILR site     6.  Follow-up with Dr. Marcello Moores in one month        Medications to take at home:    DATE: __________  Physician: ______________________________

## 2022-09-18 NOTE — Progress Notes (Signed)
Postoperative discharge instructions reviewed with patient and all  questions answered. Patient verbalizes understanding. Patient dressed self.

## 2022-11-20 ENCOUNTER — Encounter

## 2022-12-10 ENCOUNTER — Ambulatory Visit: Payer: MEDICARE | Primary: Internal Medicine

## 2022-12-10 ENCOUNTER — Inpatient Hospital Stay: Admit: 2022-12-10 | Payer: MEDICARE | Attending: "Endocrinology | Primary: Internal Medicine

## 2022-12-10 DIAGNOSIS — M810 Age-related osteoporosis without current pathological fracture: Secondary | ICD-10-CM

## 2023-12-03 ENCOUNTER — Inpatient Hospital Stay
Admit: 2023-12-03 | Discharge: 2023-12-04 | Disposition: A | Discharge status: Home / Self Care | Payer: MEDICARE | Attending: Student in an Organized Health Care Education/Training Program

## 2023-12-03 ENCOUNTER — Emergency Department: Admit: 2023-12-04 | Payer: MEDICARE | Primary: Internal Medicine

## 2023-12-03 DIAGNOSIS — W19XXXA Unspecified fall, initial encounter: Secondary | ICD-10-CM

## 2023-12-03 DIAGNOSIS — S0003XA Contusion of scalp, initial encounter: Secondary | ICD-10-CM

## 2023-12-03 NOTE — ED Notes (Signed)
Patient reports needs to urinate. Patient is a high fall risk with confusion r/t dementia. Purewic put in place and working appropriately. Patient tolerating well.

## 2023-12-03 NOTE — ED Notes (Signed)
Patient ambulated approximately 100 ft without assistive devices. Patient ambulated with steady gait and denied pain.

## 2023-12-03 NOTE — ED Triage Notes (Addendum)
Patient brought in via EMS following ground level fall at her facility. Patient was having several drinks during happy hour and tripped and fell. Patient complaining of lower back pain and bump on head.    Patient currently denies any pain. Patient disoriented to time otherwise answers A&O questions appropriately.     Bed alarm placed during triage.     Patient is on Eliquis

## 2023-12-03 NOTE — Discharge Instructions (Addendum)
Please return to an emergency department for further evaluation if you experience severe headache, difficulty walking, changes in your speech, muscle weakness, or other new and concerning symptom.

## 2023-12-03 NOTE — ED Provider Notes (Signed)
[facility]  EMERGENCY DEPARTMENT ENCOUNTER      Pt Name: Jillian Gaines  MRN: [id number]  Birthdate 04-16-1946  Date of evaluation: 12/03/2023  Provider: Donia Pounds, MD    CHIEF COMPLAINT       Chief Complaint   Patient presents with    Fall         HISTORY OF PRESENT ILLNESS    Nursing Triage Notes were reviewed.    HPI    Jillian Gaines is a 78 y.o. female who presents to the emergency department for evaluation of back pain and head injury after a fall. Patient brought in by EMS after ground level fall at her facility during happy hour. She reports she tripped and fell hitting her head. Takes Eliquis with history of atrial fibrillation. Complains of a slight bump on the back of her head where she hit it. Complains of mild lower back pain since falling. Denies having had lower back pain today prior to falling. Denies associated weakness, paresthesias, saddle anesthesia, or changes in urination. States she has been ambulatory since falling without difficulty. Disoriented to time on arrival to ED.       PAST MEDICAL HISTORY     Past Medical History:   Diagnosis Date    Anemia     posthemorrhagic    Anxiety     Atrial fibrillation (HCC)     Dementia (HCC)     Depression     Diverticulosis     GI bleed     Hemorrhoids     Repeated falls          SURGICAL HISTORY       Past Surgical History:   Procedure Laterality Date    EP DEVICE PROCEDURE N/A 09/18/2022    Loop recorder insert performed by Yolanda Manges, MD at [facility] CARDIAC CATH LAB         CURRENT MEDICATIONS       Discharge Medication List as of 12/03/2023 11:43 PM        CONTINUE these medications which have NOT CHANGED    Details   apixaban (ELIQUIS) 2.5 MG TABS tablet Take 1 tablet by mouth 2 times dailyHistorical Med      ferrous sulfate (IRON 325) 325 (65 Fe) MG tablet Take 1 tablet by mouth daily (with breakfast)Historical Med      metoprolol succinate (TOPROL XL) 50 MG extended release tablet Take 1 tablet by mouth dailyHistorical  Med      escitalopram (LEXAPRO) 20 MG tablet Take 1 tablet by mouth dailyHistorical Med      buPROPion (WELLBUTRIN XL) 150 MG extended release tablet Take 1 tablet by mouth every morningHistorical Med      atorvastatin (LIPITOR) 10 MG tablet Take 1 tablet by mouth dailyHistorical Med      amLODIPine (NORVASC) 5 MG tablet Take 1 tablet by mouth dailyHistorical Med      calcium carbonate (OSCAL) 500 MG TABS tablet Take 1 tablet by mouth dailyHistorical Med      Multiple Vitamins-Minerals (WOMENS 50+ MULTI VITAMIN PO) Take 1 tablet by mouth dailyHistorical Med      Cyanocobalamin (VITAMIN B 12 PO) Take 1,000 mcg by mouth dailyHistorical Med      Cholecalciferol (VITAMIN D3) 50 MCG (2000 UT) CAPS Take 1 capsule by mouth in the morning and at bedtime In gummy formHistorical Med             ALLERGIES     Acetaminophen, Alendronate  sodium, Hydrocodone, Promethazine, and Simvastatin    FAMILY HISTORY     History reviewed. No pertinent family history.       SOCIAL HISTORY       Social History     Socioeconomic History    Marital status: Divorced     Spouse name: None    Number of children: None    Years of education: None    Highest education level: None   Tobacco Use    Smoking status: Unknown   Substance and Sexual Activity    Alcohol use: Yes           PHYSICAL EXAM       ED Triage Vitals [12/03/23 1918]   BP Systolic BP Percentile Diastolic BP Percentile Temp Temp Source Pulse Respirations SpO2   126/80 -- -- 98.3 F (36.8 C) Tympanic 72 16 94 %      Height Weight - Scale         -- 63.4 kg (139 lb 12.4 oz)             Body mass index is 23.26 kg/m.    Physical Exam    Constitutional: Alert, well-nourished, in no acute distress  Eye: normal conjunctiva, visually fixates and follows  HENT: Normocephalic, posterior scalp hematoma without associated skin injury, nares clear, mucosa moist  Neck: Supple  Respiratory: Respirations nonlabored on room air, lungs clear to auscultation bilaterally, speaking in full sentences,  satting well on room air  Cardiovascular: Regular rate and rhythm, radial and DP pulses 2+ and equal  Abdominal: Soft, non-distended, nontender  Musculoskeletal: Nontender to palpation throughout head and face. C/T/L/S spine nontender to palpation. Nontender to palpation throughout chest wall, abdomen, pelvis, upper and lower extremities. No bony instability on palpation on head to toe exam.  Integumentary: Warm, dry, intact, no ecchymosis or skin injury over back  Neurologic: Alert and oriented, answering questions appropriately. No slurring of speech. Facial muscles symmetric. Moving all extremities with 5/5 strength. Sensation intact in all extremities.     DIAGNOSTIC RESULTS     EKG: All EKG's are interpreted by the Emergency Department Physician who either signs or Co-signs this chart in the absence of a cardiologist.        RADIOLOGY:   Non-plain film images such as CT, Ultrasound and MRI are read by the radiologist. Plain radiographic images are visualized and preliminarily interpreted by the emergency physician with the below findings:        Interpretation per the Radiologist below, if available at the time of this note:    XR LUMBAR SPINE (2-3 VIEWS)   Final Result   No obvious acute bony abnormality of the lumbar spine radiographically.                  Electronically signed by Creed Copper      CT HEAD WO CONTRAST   Final Result   CT Head:   1. No evidence of acute intracranial abnormality. Moderate midline parietal   scalp hematoma.      CT Cervical Spine:   1. No evidence of acute fracture.      Electronically signed by Salley Scarlet      CT CERVICAL SPINE WO CONTRAST   Final Result   CT Head:   1. No evidence of acute intracranial abnormality. Moderate midline parietal   scalp hematoma.      CT Cervical Spine:   1. No evidence of acute fracture.  Electronically signed by Salley Scarlet           LABS:  Labs Reviewed - No data to display    All other labs were within normal range or not  returned as of this dictation.    EMERGENCY DEPARTMENT COURSE and DIFFERENTIAL DIAGNOSIS/MDM:   Vitals:    Vitals:    12/03/23 1918 12/03/23 2200   BP: 126/80 (!) 143/89   Pulse: 72 84   Resp: 16 14   Temp: 98.3 F (36.8 C)    TempSrc: Tympanic    SpO2: 94% 99%   Weight: 63.4 kg (139 lb 12.4 oz)            Medical Decision Making  Amount and/or Complexity of Data Reviewed  Radiology:  Decision-making details documented in ED Course.        This patient presents with head trauma after a ground level mechanical fall. DDX includes MSK trauma, facial fractures, ICH or traumatic SAH, C-spine injury.  Imaging recommended by [place] CT head rule due to age, anticoagulation. No signs of other injuries on secondary exam. Patient denies any symptoms leading up to fall to suggest nonmechanical etiology. Patient is anticoagulated.     Plan: head/neck CT, XR lumbar spine, pain control PRN (declined on arrival denying any pain currently), reassess, discharge if workup and re-evaluation reassuring    Imaging unremarkable. Patient ambulatory without difficulty on reassessment and pain controlled. Will discharge with return precautions.        REASSESSMENT   MEDICATIONS GIVEN:   Medications - No data to display    ED Course as of 12/04/23 1141   Thu Dec 03, 2023   2037 CT HEAD WO CONTRAST  IMPRESSION:  CT Head:  1. No evidence of acute intracranial abnormality. Moderate midline parietal  scalp hematoma.     CT Cervical Spine:  1. No evidence of acute fracture.   [LT]   2201 Denies pain on re-evaluation. Will road test and DC if ambulatory. [LT]      ED Course User Index  [LT] Donia Pounds, MD           CONSULTS:  None    PROCEDURES:  Unless otherwise noted below, none     Procedures      FINAL IMPRESSION      1. Fall, initial encounter    2. Closed head injury, initial encounter    3. Hematoma of scalp, initial encounter    4. Acute midline low back pain without sciatica          DISPOSITION/PLAN   DISPOSITION Decision  To Discharge 12/03/2023 10:58:45 PM    Discharge home with outpatient follow up with PCP      PLAN    PATIENT REFERRED TO:   Charlene Brooke, DO  [address]  [place] [address]  [phone]    Schedule an appointment as soon as possible for a visit       [facility]  [address]  [place] [address]  [phone]    As needed, If symptoms worsen    DISCHARGE MEDICATIONS:  Discharge Medication List as of 12/03/2023 11:43 PM          (Please note that portions of this note were completed with a voice recognition program.  Efforts were made to edit the dictations but occasionally words are mis-transcribed.)    Donia Pounds, MD (electronically signed)  Emergency Attending Physician  Donia Pounds, MD  12/04/23 [phone]

## 2024-01-04 ENCOUNTER — Encounter

## 2024-01-11 ENCOUNTER — Encounter

## 2024-01-11 ENCOUNTER — Inpatient Hospital Stay: Admit: 2024-01-11 | Payer: MEDICARE | Primary: Internal Medicine

## 2024-01-11 VITALS — BP 131/72 | HR 79 | Temp 98.60000°F | Resp 17

## 2024-01-11 DIAGNOSIS — M810 Age-related osteoporosis without current pathological fracture: Secondary | ICD-10-CM

## 2024-01-11 LAB — COMPREHENSIVE METABOLIC PANEL
ALT: 11 U/L — ABNORMAL LOW (ref 12–78)
AST: 10 U/L — ABNORMAL LOW (ref 15–37)
Albumin/Globulin Ratio: 1 — ABNORMAL LOW (ref 1.1–2.2)
Albumin: 3.3 g/dL — ABNORMAL LOW (ref 3.5–5.0)
Alk Phosphatase: 58 U/L (ref 45–117)
Anion Gap: 7 mmol/L (ref 2–12)
BUN/Creatinine Ratio: 19 (ref 12–20)
BUN: 13 mg/dL (ref 6–20)
CO2: 24 mmol/L (ref 21–32)
Calcium: 9 mg/dL (ref 8.5–10.1)
Chloride: 107 mmol/L (ref 97–108)
Creatinine: 0.7 mg/dL (ref 0.55–1.02)
Est, Glom Filt Rate: 88 mL/min/{1.73_m2} (ref >60)
Globulin: 3.3 g/dL (ref 2.0–4.0)
Glucose: 119 mg/dL — ABNORMAL HIGH (ref 65–100)
Potassium: 3.7 mmol/L (ref 3.5–5.1)
Sodium: 138 mmol/L (ref 136–145)
Total Bilirubin: 0.3 mg/dL (ref 0.2–1.0)
Total Protein: 6.6 g/dL (ref 6.4–8.2)

## 2024-01-11 LAB — POC CHEM 8
Anion Gap, POC: 10.3 mmol/L (ref 10–20)
Anion Gap, POC: 12.5 mmol/L (ref 10–20)
POC Chloride: 105 mmol/L (ref 98–107)
POC Chloride: 106 mmol/L (ref 98–107)
POC Creatinine: 0.7 mg/dL (ref 0.6–1.3)
POC Creatinine: 0.71 mg/dL (ref 0.6–1.3)
POC Glucose: 120 mg/dL — ABNORMAL HIGH (ref 74–99)
POC Glucose: 121 mg/dL — ABNORMAL HIGH (ref 74–99)
POC Ionized Calcium: 1.07 mmol/L — ABNORMAL LOW (ref 1.15–1.33)
POC Ionized Calcium: 1.09 mmol/L — ABNORMAL LOW (ref 1.15–1.33)
POC Potassium: 3.7 mmol/L (ref 3.5–5.1)
POC Potassium: 3.8 mmol/L (ref 3.5–5.1)
POC Sodium: 141 mmol/L (ref 136–145)
POC Sodium: 142 mmol/L (ref 136–145)
POC TCO2: 24.5 mmol/L (ref 21–32)
POC TCO2: 24.7 mmol/L (ref 21–32)
eGFR, POC: 86 mL/min/{1.73_m2} (ref >60)
eGFR, POC: 88 mL/min/{1.73_m2} (ref >60)

## 2024-01-11 LAB — MAGNESIUM: Magnesium: 2.3 mg/dL (ref 1.6–2.4)

## 2024-01-11 LAB — PHOSPHORUS: Phosphorus: 4.3 mg/dL (ref 2.6–4.7)

## 2024-01-11 NOTE — Progress Notes (Signed)
 Jillian Gaines presents to [facility] in no distress for Prolia required peripheral lab draw. Labs drawn via L arm. Patient tolerated well; labs sent for processing.  Patient discharged in stable condition and is aware of Prolia injection appointment on 01/13/24.    Vitals:    01/11/24 1415   BP: 131/72   Pulse: 79   Resp: 17   Temp: 98.6 F (37 C)   SpO2: 98%         Randa Spike, RN

## 2024-01-13 ENCOUNTER — Inpatient Hospital Stay: Payer: MEDICARE | Primary: Internal Medicine

## 2024-01-15 ENCOUNTER — Inpatient Hospital Stay: Admit: 2024-01-15 | Payer: MEDICARE | Primary: Internal Medicine

## 2024-01-15 VITALS — BP 134/73 | HR 74 | Temp 97.80000°F | Resp 18

## 2024-01-15 DIAGNOSIS — M810 Age-related osteoporosis without current pathological fracture: Secondary | ICD-10-CM

## 2024-01-15 MED ORDER — DENOSUMAB 60 MG/ML SC SOSY
60 | Freq: Once | SUBCUTANEOUS | Status: AC
Start: 2024-01-15 — End: 2024-01-15
  Administered 2024-01-15: 20:00:00 60 mg via SUBCUTANEOUS

## 2024-01-15 MED FILL — PROLIA 60 MG/ML SC SOSY: 60 MG/ML | SUBCUTANEOUS | Qty: 1

## 2024-01-15 NOTE — Progress Notes (Signed)
 [facility] Short Note                       Date: January 15, 2024    Name: Jillian Gaines    MRN: [id number]         DOB: 02/09/1946     Pt admit to [facility] for Prolia injection (labs done 01/11/24) ambulatory in stable condition. Assessment completed. No new concerns voiced.  Jillian Gaines's vitals were reviewed prior to treatment.   Patient Vitals for the past 12 hrs:   Temp Pulse Resp BP SpO2   01/15/24 1445 97.8 F (36.6 C) 74 18 134/73 98 %       Medications given:   Medications Administered         denosumab (PROLIA) SC injection 60 mg Admin Date  01/15/2024 Action  Given Dose  60 mg Route  SubCUTAneous Documented By  Ruthy Dick, RN        Injection given in right arm SC        Pt tolerated treatment well. D/c home ambulatory in no distress. Pt aware of next appointment scheduled for 07/18/24.    Future Appointments   Date Time Provider Department Center   07/18/2024  2:30 PM [facility]ACK [facility]       Ruthy Dick, RN  January 15, 2024  3:11 PM

## 2024-07-12 ENCOUNTER — Encounter

## 2024-07-18 ENCOUNTER — Ambulatory Visit: Payer: Medicare (Managed Care) | Primary: Internal Medicine

## 2024-07-19 ENCOUNTER — Inpatient Hospital Stay: Admit: 2024-07-19 | Discharge: 2024-07-19 | Payer: Medicare (Managed Care) | Primary: Internal Medicine

## 2024-07-19 VITALS — BP 123/80 | HR 84 | Temp 97.90000°F | Resp 18 | Ht 66.0 in | Wt 131.4 lb

## 2024-07-19 DIAGNOSIS — M810 Age-related osteoporosis without current pathological fracture: Principal | ICD-10-CM

## 2024-07-19 MED ORDER — SODIUM CHLORIDE 0.9 % IV SOLN
0.9 | INTRAVENOUS | Status: DC | PRN
Start: 2024-07-19 — End: 2024-07-20

## 2024-07-19 MED ORDER — ZOLEDRONIC ACID 5 MG/100ML IV SOLN
5 | Freq: Once | INTRAVENOUS | Status: AC
Start: 2024-07-19 — End: 2024-07-19
  Administered 2024-07-19: 19:00:00 5 mg via INTRAVENOUS

## 2024-07-19 MED FILL — ZOLEDRONIC ACID 5 MG/100ML IV SOLN: 5 MG/100ML | INTRAVENOUS | Qty: 100 | Fill #0

## 2024-07-19 NOTE — Progress Notes (Signed)
 [facility] Short Note                       Date: July 19, 2024    Name: Jillian Gaines    MRN: [id number]         DOB: May 26, 1946      2:30 Pt admit to [facility] for RECLAST  ambulatory in stable condition. Assessment completed. No new concerns voiced.  Please review pending lab results in CC.      Jillian Gaines's vitals were reviewed prior to treatment.   Patient Vitals for the past 12 hrs:   Temp Pulse Resp BP SpO2   07/19/24 1427 97.9 F (36.6 C) 84 18 123/80 97 %         Lab results were obtained and reviewed.    Medications given: PIV #24 left ac  Medications Administered         zoledronic  acid (RECLAST ) 5 mg/100 mL infusion Admin Date  07/19/2024 Action  New Bag Dose  5 mg Rate  400 mL/hr Route  IntraVENous Documented By  Vannie Lafaye CROME, RN          Jillian Gaines tolerated the infusion, and had no complaints.    [facility] G[facility].     No future appointments.    Lafaye CROME Vannie, RN  July 19, 2024  2:59 PM
